# Patient Record
Sex: Female | Born: 1985 | Race: Black or African American | Hispanic: No | Marital: Single | Smoking: Never smoker
Health system: Southern US, Community
[De-identification: ages and names within clinical notes are randomized; demographics above are authoritative.]

## PROBLEM LIST (undated history)

## (undated) DIAGNOSIS — I10 Essential (primary) hypertension: Secondary | ICD-10-CM

## (undated) DIAGNOSIS — R42 Dizziness and giddiness: Secondary | ICD-10-CM

## (undated) DIAGNOSIS — F419 Anxiety disorder, unspecified: Secondary | ICD-10-CM

## (undated) DIAGNOSIS — F32A Depression, unspecified: Secondary | ICD-10-CM

## (undated) HISTORY — DX: Dizziness and giddiness: R42

## (undated) HISTORY — PX: NO PAST SURGERIES: SHX2092

## (undated) HISTORY — DX: Depression, unspecified: F32.A

## (undated) HISTORY — DX: Anxiety disorder, unspecified: F41.9

---
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
## 2004-03-18 ENCOUNTER — Emergency Department (HOSPITAL_COMMUNITY): Admission: EM | Admit: 2004-03-18 | Discharge: 2004-03-18 | Payer: Self-pay | Admitting: Emergency Medicine

## 2004-05-15 ENCOUNTER — Emergency Department (HOSPITAL_COMMUNITY): Admission: EM | Admit: 2004-05-15 | Discharge: 2004-05-16 | Payer: Self-pay | Admitting: Emergency Medicine

## 2005-03-23 ENCOUNTER — Emergency Department (HOSPITAL_COMMUNITY): Admission: EM | Admit: 2005-03-23 | Discharge: 2005-03-23 | Payer: Self-pay | Admitting: Family Medicine

## 2005-04-17 ENCOUNTER — Emergency Department (HOSPITAL_COMMUNITY): Admission: EM | Admit: 2005-04-17 | Discharge: 2005-04-17 | Payer: Self-pay | Admitting: Family Medicine

## 2005-05-09 ENCOUNTER — Emergency Department (HOSPITAL_COMMUNITY): Admission: EM | Admit: 2005-05-09 | Discharge: 2005-05-09 | Payer: Self-pay | Admitting: Family Medicine

## 2005-05-24 ENCOUNTER — Emergency Department (HOSPITAL_COMMUNITY): Admission: EM | Admit: 2005-05-24 | Discharge: 2005-05-24 | Payer: Self-pay | Admitting: Family Medicine

## 2006-02-16 ENCOUNTER — Emergency Department (HOSPITAL_COMMUNITY): Admission: EM | Admit: 2006-02-16 | Discharge: 2006-02-17 | Payer: Self-pay | Admitting: Emergency Medicine

## 2006-02-23 ENCOUNTER — Emergency Department (HOSPITAL_COMMUNITY): Admission: EM | Admit: 2006-02-23 | Discharge: 2006-02-24 | Payer: Self-pay | Admitting: Emergency Medicine

## 2006-02-28 ENCOUNTER — Emergency Department (HOSPITAL_COMMUNITY): Admission: EM | Admit: 2006-02-28 | Discharge: 2006-03-01 | Payer: Self-pay | Admitting: Emergency Medicine

## 2006-02-28 ENCOUNTER — Emergency Department (HOSPITAL_COMMUNITY): Admission: EM | Admit: 2006-02-28 | Discharge: 2006-02-28 | Payer: Self-pay | Admitting: Emergency Medicine

## 2006-04-07 ENCOUNTER — Ambulatory Visit: Payer: Self-pay | Admitting: Family Medicine

## 2006-10-22 ENCOUNTER — Emergency Department (HOSPITAL_COMMUNITY): Admission: EM | Admit: 2006-10-22 | Discharge: 2006-10-22 | Payer: Self-pay | Admitting: Family Medicine

## 2007-01-07 ENCOUNTER — Emergency Department (HOSPITAL_COMMUNITY): Admission: EM | Admit: 2007-01-07 | Discharge: 2007-01-07 | Payer: Self-pay | Admitting: Family Medicine

## 2007-02-16 ENCOUNTER — Emergency Department (HOSPITAL_COMMUNITY): Admission: EM | Admit: 2007-02-16 | Discharge: 2007-02-16 | Payer: Self-pay | Admitting: Emergency Medicine

## 2007-07-12 ENCOUNTER — Emergency Department (HOSPITAL_COMMUNITY): Admission: EM | Admit: 2007-07-12 | Discharge: 2007-07-12 | Payer: Self-pay | Admitting: Family Medicine

## 2007-09-20 ENCOUNTER — Emergency Department (HOSPITAL_COMMUNITY): Admission: EM | Admit: 2007-09-20 | Discharge: 2007-09-20 | Payer: Self-pay | Admitting: Family Medicine

## 2008-04-18 ENCOUNTER — Emergency Department (HOSPITAL_COMMUNITY): Admission: EM | Admit: 2008-04-18 | Discharge: 2008-04-18 | Payer: Self-pay | Admitting: Family Medicine

## 2008-07-28 ENCOUNTER — Emergency Department (HOSPITAL_COMMUNITY): Admission: EM | Admit: 2008-07-28 | Discharge: 2008-07-28 | Payer: Self-pay | Admitting: Family Medicine

## 2008-09-17 ENCOUNTER — Emergency Department (HOSPITAL_COMMUNITY): Admission: EM | Admit: 2008-09-17 | Discharge: 2008-09-18 | Payer: Self-pay | Admitting: Emergency Medicine

## 2009-09-03 ENCOUNTER — Emergency Department (HOSPITAL_COMMUNITY): Admission: EM | Admit: 2009-09-03 | Discharge: 2009-09-03 | Payer: Self-pay | Admitting: Emergency Medicine

## 2009-11-25 ENCOUNTER — Emergency Department (HOSPITAL_COMMUNITY): Admission: EM | Admit: 2009-11-25 | Discharge: 2009-11-25 | Payer: Self-pay | Admitting: Emergency Medicine

## 2010-02-01 ENCOUNTER — Emergency Department (HOSPITAL_COMMUNITY): Admission: EM | Admit: 2010-02-01 | Discharge: 2010-02-01 | Payer: Self-pay | Admitting: Emergency Medicine

## 2010-04-18 ENCOUNTER — Emergency Department (HOSPITAL_COMMUNITY): Admission: EM | Admit: 2010-04-18 | Discharge: 2010-04-18 | Payer: Self-pay | Admitting: Family Medicine

## 2010-04-20 ENCOUNTER — Emergency Department (HOSPITAL_COMMUNITY): Admission: EM | Admit: 2010-04-20 | Discharge: 2010-04-20 | Payer: Self-pay | Admitting: Emergency Medicine

## 2010-04-29 ENCOUNTER — Emergency Department (HOSPITAL_COMMUNITY): Admission: EM | Admit: 2010-04-29 | Discharge: 2010-04-29 | Payer: Self-pay | Admitting: Emergency Medicine

## 2010-07-03 ENCOUNTER — Emergency Department (HOSPITAL_COMMUNITY)
Admission: EM | Admit: 2010-07-03 | Discharge: 2010-07-04 | Payer: Self-pay | Source: Home / Self Care | Admitting: Emergency Medicine

## 2010-07-06 LAB — POCT I-STAT, CHEM 8
BUN: 11 mg/dL (ref 6–23)
Calcium, Ion: 1.16 mmol/L (ref 1.12–1.32)
Chloride: 110 mEq/L (ref 96–112)
Creatinine, Ser: 0.9 mg/dL (ref 0.4–1.2)
Glucose, Bld: 88 mg/dL (ref 70–99)
HCT: 42 % (ref 36.0–46.0)
Hemoglobin: 14.3 g/dL (ref 12.0–15.0)
Potassium: 3.7 mEq/L (ref 3.5–5.1)
Sodium: 143 mEq/L (ref 135–145)
TCO2: 23 mmol/L (ref 0–100)

## 2010-07-06 LAB — URINALYSIS, ROUTINE W REFLEX MICROSCOPIC
Bilirubin Urine: NEGATIVE
Ketones, ur: NEGATIVE mg/dL
Nitrite: NEGATIVE
Protein, ur: NEGATIVE mg/dL
Specific Gravity, Urine: 1.02 (ref 1.005–1.030)
Urine Glucose, Fasting: NEGATIVE mg/dL
Urobilinogen, UA: 0.2 mg/dL (ref 0.0–1.0)
pH: 7.5 (ref 5.0–8.0)

## 2010-07-06 LAB — URINE MICROSCOPIC-ADD ON

## 2010-07-06 LAB — URINE CULTURE
Colony Count: NO GROWTH
Culture  Setup Time: 201201140557
Culture: NO GROWTH

## 2010-07-06 LAB — POCT PREGNANCY, URINE: Preg Test, Ur: NEGATIVE

## 2010-07-24 ENCOUNTER — Inpatient Hospital Stay (INDEPENDENT_AMBULATORY_CARE_PROVIDER_SITE_OTHER)
Admission: RE | Admit: 2010-07-24 | Discharge: 2010-07-24 | Disposition: A | Discharge status: Home / Self Care | Payer: Self-pay | Source: Ambulatory Visit | Attending: Family Medicine | Admitting: Family Medicine

## 2010-07-24 ENCOUNTER — Emergency Department (HOSPITAL_COMMUNITY)
Admission: EM | Admit: 2010-07-24 | Discharge: 2010-07-24 | Disposition: A | Discharge status: Home / Self Care | Payer: Self-pay | Attending: Emergency Medicine | Admitting: Emergency Medicine

## 2010-07-24 ENCOUNTER — Emergency Department (HOSPITAL_COMMUNITY): Payer: Self-pay

## 2010-07-24 DIAGNOSIS — R109 Unspecified abdominal pain: Secondary | ICD-10-CM

## 2010-07-24 DIAGNOSIS — N390 Urinary tract infection, site not specified: Secondary | ICD-10-CM | POA: Insufficient documentation

## 2010-07-24 LAB — BASIC METABOLIC PANEL
BUN: 7 mg/dL (ref 6–23)
CO2: 22 mEq/L (ref 19–32)
Calcium: 9.3 mg/dL (ref 8.4–10.5)
Chloride: 108 mEq/L (ref 96–112)
Creatinine, Ser: 0.84 mg/dL (ref 0.4–1.2)
GFR calc Af Amer: >60 mL/min (ref >60)
GFR calc non Af Amer: >60 mL/min (ref >60)
Glucose, Bld: 87 mg/dL (ref 70–99)
Potassium: 3.8 mEq/L (ref 3.5–5.1)
Sodium: 140 mEq/L (ref 135–145)

## 2010-07-24 LAB — URINALYSIS, ROUTINE W REFLEX MICROSCOPIC
Bilirubin Urine: NEGATIVE
Hgb urine dipstick: NEGATIVE
Ketones, ur: NEGATIVE mg/dL
Nitrite: NEGATIVE
Protein, ur: NEGATIVE mg/dL
Specific Gravity, Urine: 1.02 (ref 1.005–1.030)
Urine Glucose, Fasting: NEGATIVE mg/dL
Urobilinogen, UA: 1 mg/dL (ref 0.0–1.0)
pH: 7 (ref 5.0–8.0)

## 2010-07-24 LAB — DIFFERENTIAL
Basophils Absolute: 0 10*3/uL (ref 0.0–0.1)
Basophils Relative: 0 % (ref 0–1)
Eosinophils Absolute: 0.1 10*3/uL (ref 0.0–0.7)
Eosinophils Relative: 1 % (ref 0–5)
Lymphocytes Relative: 50 % — ABNORMAL HIGH (ref 12–46)
Lymphs Abs: 2.4 10*3/uL (ref 0.7–4.0)
Monocytes Absolute: 0.3 10*3/uL (ref 0.1–1.0)
Monocytes Relative: 7 % (ref 3–12)
Neutro Abs: 1.9 10*3/uL (ref 1.7–7.7)
Neutrophils Relative %: 41 % — ABNORMAL LOW (ref 43–77)

## 2010-07-24 LAB — POCT PREGNANCY, URINE
Preg Test, Ur: NEGATIVE
Preg Test, Ur: NEGATIVE

## 2010-07-24 LAB — POCT URINALYSIS DIPSTICK
Bilirubin Urine: NEGATIVE
Hgb urine dipstick: NEGATIVE
Ketones, ur: NEGATIVE mg/dL
Nitrite: NEGATIVE
Protein, ur: NEGATIVE mg/dL
Specific Gravity, Urine: 1.02 (ref 1.005–1.030)
Urine Glucose, Fasting: NEGATIVE mg/dL
Urobilinogen, UA: 0.2 mg/dL (ref 0.0–1.0)
pH: 7 (ref 5.0–8.0)

## 2010-07-24 LAB — CBC
HCT: 41.8 % (ref 36.0–46.0)
Hemoglobin: 14.4 g/dL (ref 12.0–15.0)
MCH: 29 pg (ref 26.0–34.0)
MCHC: 34.4 g/dL (ref 30.0–36.0)
MCV: 84.1 fL (ref 78.0–100.0)
Platelets: 170 10*3/uL (ref 150–400)
RBC: 4.97 MIL/uL (ref 3.87–5.11)
RDW: 13 % (ref 11.5–15.5)
WBC: 4.7 10*3/uL (ref 4.0–10.5)

## 2010-07-24 LAB — URINE MICROSCOPIC-ADD ON

## 2010-07-26 LAB — URINE CULTURE
Colony Count: 30000
Culture  Setup Time: 201202031841

## 2010-09-01 LAB — BASIC METABOLIC PANEL
BUN: 10 mg/dL (ref 6–23)
CO2: 21 mEq/L (ref 19–32)
Calcium: 9.3 mg/dL (ref 8.4–10.5)
Chloride: 111 mEq/L (ref 96–112)
Creatinine, Ser: 0.88 mg/dL (ref 0.4–1.2)
GFR calc Af Amer: >60 mL/min (ref >60)
GFR calc non Af Amer: >60 mL/min (ref >60)
Glucose, Bld: 106 mg/dL — ABNORMAL HIGH (ref 70–99)
Potassium: 3.4 mEq/L — ABNORMAL LOW (ref 3.5–5.1)
Sodium: 139 mEq/L (ref 135–145)

## 2010-09-01 LAB — POCT CARDIAC MARKERS
CKMB, poc: <1 ng/mL — ABNORMAL LOW (ref 1.0–8.0)
Myoglobin, poc: 36.7 ng/mL (ref 12–200)
Myoglobin, poc: 47.2 ng/mL (ref 12–200)

## 2010-09-01 LAB — CBC
HCT: 40.6 % (ref 36.0–46.0)
Hemoglobin: 13.8 g/dL (ref 12.0–15.0)
MCH: 29.1 pg (ref 26.0–34.0)
MCHC: 34.1 g/dL (ref 30.0–36.0)
MCV: 85.2 fL (ref 78.0–100.0)
Platelets: 164 10*3/uL (ref 150–400)
RBC: 4.76 MIL/uL (ref 3.87–5.11)
RDW: 13.9 % (ref 11.5–15.5)
WBC: 5.8 10*3/uL (ref 4.0–10.5)

## 2010-09-01 LAB — DIFFERENTIAL
Basophils Absolute: 0.1 10*3/uL (ref 0.0–0.1)
Basophils Relative: 1 % (ref 0–1)
Eosinophils Absolute: 0.1 10*3/uL (ref 0.0–0.7)
Eosinophils Relative: 1 % (ref 0–5)
Lymphocytes Relative: 33 % (ref 12–46)
Lymphs Abs: 1.9 10*3/uL (ref 0.7–4.0)
Monocytes Absolute: 0.4 10*3/uL (ref 0.1–1.0)
Monocytes Relative: 7 % (ref 3–12)
Neutro Abs: 3.3 10*3/uL (ref 1.7–7.7)
Neutrophils Relative %: 58 % (ref 43–77)

## 2010-09-02 LAB — URINALYSIS, ROUTINE W REFLEX MICROSCOPIC
Bilirubin Urine: NEGATIVE
Glucose, UA: NEGATIVE mg/dL
Ketones, ur: 15 mg/dL — AB
Nitrite: NEGATIVE
Protein, ur: NEGATIVE mg/dL
Specific Gravity, Urine: 1.02 (ref 1.005–1.030)
Specific Gravity, Urine: 1.03 (ref 1.005–1.030)
Urobilinogen, UA: 1 mg/dL (ref 0.0–1.0)
pH: 6 (ref 5.0–8.0)

## 2010-09-02 LAB — URINE CULTURE
Colony Count: 15000
Culture  Setup Time: 201110292019

## 2010-09-02 LAB — URINE MICROSCOPIC-ADD ON

## 2010-09-02 LAB — POCT URINALYSIS DIPSTICK
Bilirubin Urine: NEGATIVE
Nitrite: NEGATIVE
Specific Gravity, Urine: 1.025 (ref 1.005–1.030)
pH: 6.5 (ref 5.0–8.0)

## 2010-09-07 LAB — DIFFERENTIAL
Basophils Absolute: 0 10*3/uL (ref 0.0–0.1)
Basophils Relative: 1 % (ref 0–1)
Eosinophils Absolute: 0 10*3/uL (ref 0.0–0.7)
Eosinophils Relative: 1 % (ref 0–5)
Lymphocytes Relative: 30 % (ref 12–46)
Lymphs Abs: 1.4 10*3/uL (ref 0.7–4.0)
Monocytes Absolute: 0.3 10*3/uL (ref 0.1–1.0)
Monocytes Relative: 7 % (ref 3–12)
Neutro Abs: 2.9 10*3/uL (ref 1.7–7.7)
Neutrophils Relative %: 63 % (ref 43–77)

## 2010-09-07 LAB — URINE MICROSCOPIC-ADD ON

## 2010-09-07 LAB — CBC
HCT: 41.9 % (ref 36.0–46.0)
Hemoglobin: 14.5 g/dL (ref 12.0–15.0)
MCHC: 34.6 g/dL (ref 30.0–36.0)
MCV: 86.4 fL (ref 78.0–100.0)
Platelets: 149 10*3/uL — ABNORMAL LOW (ref 150–400)
RBC: 4.85 MIL/uL (ref 3.87–5.11)
RDW: 13.4 % (ref 11.5–15.5)
WBC: 4.6 10*3/uL (ref 4.0–10.5)

## 2010-09-07 LAB — URINALYSIS, ROUTINE W REFLEX MICROSCOPIC
Bilirubin Urine: NEGATIVE
Glucose, UA: NEGATIVE mg/dL
Hgb urine dipstick: NEGATIVE
Ketones, ur: NEGATIVE mg/dL
Nitrite: NEGATIVE
Protein, ur: NEGATIVE mg/dL
Specific Gravity, Urine: 1.026 (ref 1.005–1.030)
Urobilinogen, UA: 1 mg/dL (ref 0.0–1.0)
pH: 6.5 (ref 5.0–8.0)

## 2010-09-07 LAB — BASIC METABOLIC PANEL
BUN: 8 mg/dL (ref 6–23)
CO2: 21 mEq/L (ref 19–32)
Calcium: 8.9 mg/dL (ref 8.4–10.5)
Chloride: 111 mEq/L (ref 96–112)
Creatinine, Ser: 0.83 mg/dL (ref 0.4–1.2)
GFR calc Af Amer: >60 mL/min (ref >60)
GFR calc non Af Amer: >60 mL/min (ref >60)
Glucose, Bld: 98 mg/dL (ref 70–99)
Potassium: 3.1 mEq/L — ABNORMAL LOW (ref 3.5–5.1)
Sodium: 139 mEq/L (ref 135–145)

## 2010-09-07 LAB — POCT PREGNANCY, URINE: Preg Test, Ur: NEGATIVE

## 2010-09-07 LAB — URINE CULTURE: Colony Count: 1000

## 2010-09-07 LAB — D-DIMER, QUANTITATIVE: D-Dimer, Quant: 0.44 ug/mL-FEU (ref 0.00–0.48)

## 2010-09-13 LAB — POCT I-STAT, CHEM 8
BUN: 13 mg/dL (ref 6–23)
Calcium, Ion: 1.16 mmol/L (ref 1.12–1.32)
HCT: 43 % (ref 36.0–46.0)
Hemoglobin: 14.6 g/dL (ref 12.0–15.0)
Sodium: 141 mEq/L (ref 135–145)
TCO2: 25 mmol/L (ref 0–100)

## 2010-09-13 LAB — D-DIMER, QUANTITATIVE: D-Dimer, Quant: 0.4 ug/mL-FEU (ref 0.00–0.48)

## 2010-10-01 LAB — URINE MICROSCOPIC-ADD ON

## 2010-10-01 LAB — URINALYSIS, ROUTINE W REFLEX MICROSCOPIC
Glucose, UA: NEGATIVE mg/dL
Protein, ur: 30 mg/dL — AB
Specific Gravity, Urine: 1.035 — ABNORMAL HIGH (ref 1.005–1.030)

## 2010-10-01 LAB — WET PREP, GENITAL: Yeast Wet Prep HPF POC: NONE SEEN

## 2010-10-01 LAB — GC/CHLAMYDIA PROBE AMP, GENITAL: GC Probe Amp, Genital: NEGATIVE

## 2010-11-06 NOTE — Discharge Summary (Signed)
[facility]  Patient:    Kiara Klein, Kiara Klein Visit Number: [id number] MRN: [id number]          Service Type: OBS Location: [id number] Attending Physician:  Tammi Sou Dictated by:   Marvis Moeller, C.N.M. Admit Date:  03/20/2000 Discharge Date: 03/24/2000                             Discharge Summary  ADMISSION DIAGNOSES:          1. Intrauterine pregnancy at 29-4/7 weeks.                               2. Threatened preterm labor.  DISCHARGE DIAGNOSES:          1. Intrauterine pregnancy at 25-5/7 weeks.                               2. Stable preterm cervical change.  HISTORY:                      This is a 25 year old primigravida who presented at 29-4/7 weeks dated by a 25+ week scan who came in reporting contractions for about a week intermittently and recurring on day of admission.  She also was reporting a change in the amount of her vaginal discharge.  At her routine prenatal visit at [facility], she had been given azithromycin for WBCs on wet prep. She denied urinary symptoms and no leaking or bleeding.  PAST OBSTETRICAL HISTORY:     Noncontributory.  PAST GYNECOLOGIC HISTORY:     No history of STDs.  MEDICATIONS:                  Prenatal vitamins.  ALLERGIES:                    None. ______ noncontributory.  PREGNANCY COURSE:             Her pregnancy course was essentially uncomplicated though she entered care in late second trimester.  Of note, initially her platelets were 206,000.  PHYSICAL EXAMINATION:  VITAL SIGNS:                  Blood pressure 108/65, pulse 94, fetal heart rate in 140s and reactive without decelerations.  ABDOMEN:                      Contractions were occurring every 4 to 10 minutes and palpated mild.  LUNGS:                        Clear.  HEART:                        Regular rate and rhythm.  EXTREMITIES:                  Without edema.  PELVIC:                        Cervical exam as 150, -2, cephalic.  LABORATORY DATA:              Cultures were done for gonorrhea, chlamydia, and group B strep, all of which were subsequently negative.  She  had an ultrasound prior to admission which revealed a cervical length shortened to 2.1 cm.  HOSPITAL COURSE:              In maternity admissions, she was given terbutaline initially to which she responded well.  She was admitted for IV Unasyn and IV fluids along with further monitoring.  The patient remained stable with minimal uterine activity on Unasyn and tocolytic.  She did receive betamethasone, and on day #2, she was started on Procardia 60 mg XL 1 a day after her ultrasound revealed a dynamic cervix, 1.2 to 2.5.  Her platelets were borderline low, so she had preeclampsia labs done, all of which were normal.  Her ultrasound did reveal good growth.  On day of discharge, the cervix was soft, closed, 2.5 cm, long, high, and without low uterine segment development.  She was, therefore, sent home on March 06, 2000, on bed rest.  She had seen a Child psychotherapist in hopes of getting a home bound Runner, broadcasting/film/video.  She is to follow up three days post discharge at the high-risk clinic and was continued on prenatal vitamin 1 a day and Procardia XL 60 mg 1 p.o. q.i.d.Dictated by:   Marvis Moeller, C.N.M. Attending Physician:  Tammi Sou DD:  04/12/00 TD:  04/12/00 Job: [id number]

## 2010-12-03 ENCOUNTER — Emergency Department (HOSPITAL_COMMUNITY)
Admission: EM | Admit: 2010-12-03 | Discharge: 2010-12-03 | Disposition: A | Discharge status: Home / Self Care | Payer: Medicaid Other | Attending: Emergency Medicine | Admitting: Emergency Medicine

## 2010-12-03 DIAGNOSIS — R42 Dizziness and giddiness: Secondary | ICD-10-CM | POA: Insufficient documentation

## 2010-12-03 DIAGNOSIS — R079 Chest pain, unspecified: Secondary | ICD-10-CM | POA: Insufficient documentation

## 2010-12-03 LAB — URINALYSIS, ROUTINE W REFLEX MICROSCOPIC
Bilirubin Urine: NEGATIVE
Nitrite: NEGATIVE
Specific Gravity, Urine: 1.017 (ref 1.005–1.030)
Urobilinogen, UA: 1 mg/dL (ref 0.0–1.0)

## 2010-12-03 LAB — CBC
HCT: 45.7 % (ref 36.0–46.0)
MCH: 28 pg (ref 26.0–34.0)
MCHC: 33.7 g/dL (ref 30.0–36.0)
MCV: 83.1 fL (ref 78.0–100.0)
RDW: 13.4 % (ref 11.5–15.5)

## 2010-12-03 LAB — DIFFERENTIAL
Basophils Absolute: 0 10*3/uL (ref 0.0–0.1)
Eosinophils Relative: 1 % (ref 0–5)
Lymphocytes Relative: 37 % (ref 12–46)
Monocytes Absolute: 0.4 10*3/uL (ref 0.1–1.0)

## 2010-12-03 LAB — POCT I-STAT, CHEM 8
Calcium, Ion: 1.12 mmol/L (ref 1.12–1.32)
Glucose, Bld: 79 mg/dL (ref 70–99)
HCT: 50 % — ABNORMAL HIGH (ref 36.0–46.0)
Hemoglobin: 17 g/dL — ABNORMAL HIGH (ref 12.0–15.0)
TCO2: 23 mmol/L (ref 0–100)

## 2010-12-03 LAB — POCT PREGNANCY, URINE: Preg Test, Ur: NEGATIVE

## 2010-12-03 LAB — URINE MICROSCOPIC-ADD ON

## 2011-01-16 ENCOUNTER — Emergency Department (HOSPITAL_COMMUNITY)
Admission: EM | Admit: 2011-01-16 | Discharge: 2011-01-16 | Disposition: A | Discharge status: Home / Self Care | Payer: Medicaid Other | Attending: Emergency Medicine | Admitting: Emergency Medicine

## 2011-01-16 DIAGNOSIS — R042 Hemoptysis: Secondary | ICD-10-CM | POA: Insufficient documentation

## 2011-01-16 DIAGNOSIS — R0989 Other specified symptoms and signs involving the circulatory and respiratory systems: Secondary | ICD-10-CM | POA: Insufficient documentation

## 2011-01-16 DIAGNOSIS — R110 Nausea: Secondary | ICD-10-CM | POA: Insufficient documentation

## 2011-01-16 DIAGNOSIS — R0609 Other forms of dyspnea: Secondary | ICD-10-CM | POA: Insufficient documentation

## 2011-01-16 DIAGNOSIS — M79609 Pain in unspecified limb: Secondary | ICD-10-CM | POA: Insufficient documentation

## 2011-01-16 DIAGNOSIS — R079 Chest pain, unspecified: Secondary | ICD-10-CM | POA: Insufficient documentation

## 2011-01-16 DIAGNOSIS — Z8614 Personal history of Methicillin resistant Staphylococcus aureus infection: Secondary | ICD-10-CM | POA: Insufficient documentation

## 2011-01-16 LAB — URINALYSIS, ROUTINE W REFLEX MICROSCOPIC
Ketones, ur: NEGATIVE mg/dL
Nitrite: NEGATIVE
Specific Gravity, Urine: 1.02 (ref 1.005–1.030)
Urobilinogen, UA: 0.2 mg/dL (ref 0.0–1.0)
pH: 7 (ref 5.0–8.0)

## 2011-01-16 LAB — URINE MICROSCOPIC-ADD ON

## 2011-01-16 LAB — D-DIMER, QUANTITATIVE: D-Dimer, Quant: 0.25 ug/mL-FEU (ref 0.00–0.48)

## 2011-01-18 LAB — POCT I-STAT, CHEM 8
BUN: 8 mg/dL (ref 6–23)
Creatinine, Ser: 1 mg/dL (ref 0.50–1.10)
Potassium: 3.5 mEq/L (ref 3.5–5.1)
Sodium: 141 mEq/L (ref 135–145)

## 2011-01-18 LAB — POCT PREGNANCY, URINE: Preg Test, Ur: NEGATIVE

## 2011-04-02 LAB — POCT URINALYSIS DIP (DEVICE)
Bilirubin Urine: NEGATIVE
Hgb urine dipstick: NEGATIVE
Nitrite: NEGATIVE
Specific Gravity, Urine: 1.02
pH: 6

## 2011-04-02 LAB — I-STAT 8, (EC8 V) (CONVERTED LAB)
Acid-base deficit: 3 — ABNORMAL HIGH
BUN: 7
Chloride: 107
HCT: 48 — ABNORMAL HIGH
Hemoglobin: 16.3 — ABNORMAL HIGH
Operator id: 247071
Potassium: 3.4 — ABNORMAL LOW
pCO2, Ven: 41.9 — ABNORMAL LOW

## 2011-04-02 LAB — TSH: TSH: 0.682

## 2011-04-02 LAB — CBC
Platelets: 180
RBC: 5.24 — ABNORMAL HIGH
WBC: 4.5

## 2011-04-05 LAB — HERPES SIMPLEX VIRUS CULTURE: Culture: NOT DETECTED

## 2011-04-05 LAB — GC/CHLAMYDIA PROBE AMP, GENITAL
Chlamydia, DNA Probe: NEGATIVE
GC Probe Amp, Genital: NEGATIVE

## 2011-04-05 LAB — POCT URINALYSIS DIP (DEVICE)
Glucose, UA: NEGATIVE
Nitrite: NEGATIVE
Operator id: 239701
Protein, ur: >=300 — AB
Urobilinogen, UA: 1

## 2011-04-05 LAB — POCT PREGNANCY, URINE
Operator id: 239701
Preg Test, Ur: NEGATIVE

## 2011-04-05 LAB — WET PREP, GENITAL: Clue Cells Wet Prep HPF POC: NONE SEEN

## 2012-02-11 IMAGING — CR DG ABDOMEN 1V
1 series · 1 of 1 positions shown · non-contrast
Comparison: None

CLINICAL DATA: Left flank pain.

ABDOMEN - 1 VIEW

[view not recorded]
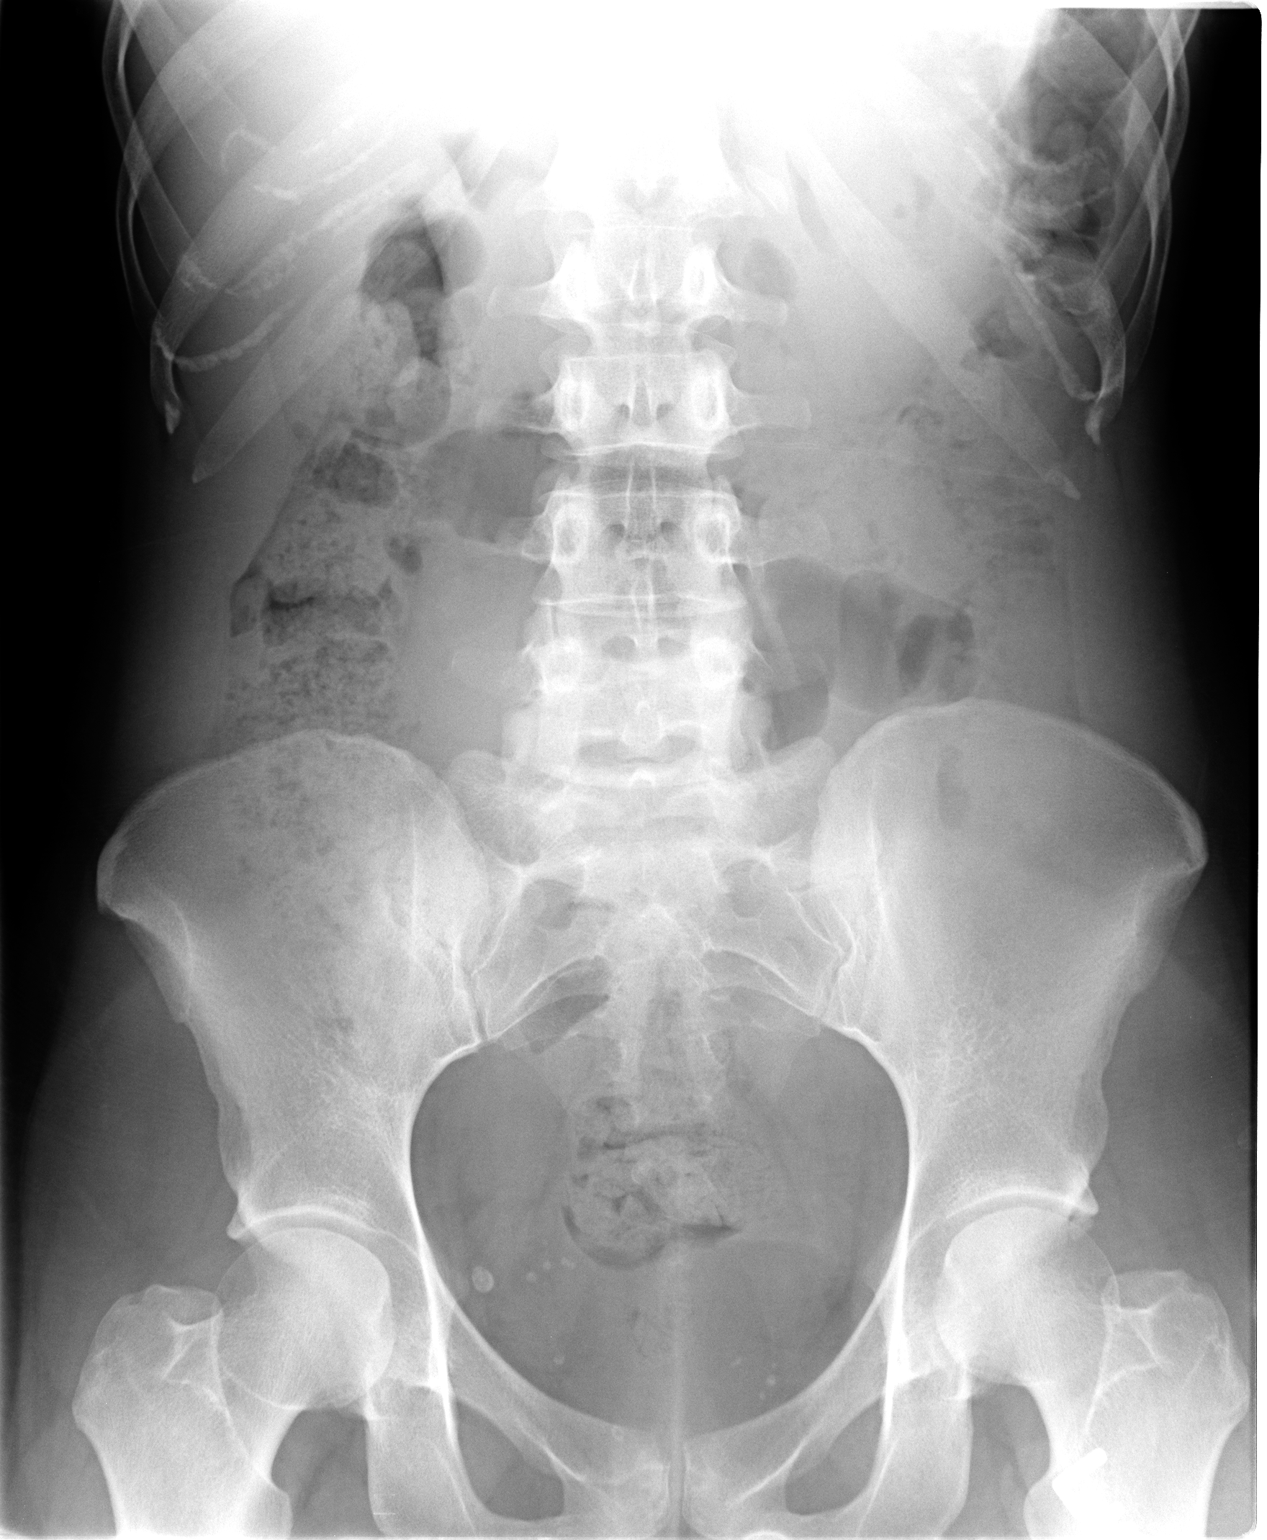

[1 of 1 positions shown; findings below may reference images not displayed]

FINDINGS: The bowel gas pattern is unremarkable.
There is no evidence of bowel obstruction.
No suspicious calcifications are noted.
The bony structures are unremarkable.
IMPRESSION: No acute abnormalities.

## 2012-03-21 ENCOUNTER — Encounter (HOSPITAL_COMMUNITY): Payer: Self-pay | Admitting: Emergency Medicine

## 2012-03-21 ENCOUNTER — Emergency Department (HOSPITAL_COMMUNITY)
Admission: EM | Admit: 2012-03-21 | Discharge: 2012-03-21 | Disposition: A | Discharge status: Home / Self Care | Payer: Self-pay | Attending: Emergency Medicine | Admitting: Emergency Medicine

## 2012-03-21 DIAGNOSIS — R109 Unspecified abdominal pain: Secondary | ICD-10-CM | POA: Insufficient documentation

## 2012-03-21 LAB — URINALYSIS, ROUTINE W REFLEX MICROSCOPIC
Bilirubin Urine: NEGATIVE
Ketones, ur: NEGATIVE mg/dL
Nitrite: NEGATIVE
Protein, ur: NEGATIVE mg/dL
Urobilinogen, UA: 0.2 mg/dL (ref 0.0–1.0)

## 2012-03-21 MED ORDER — POLYETHYLENE GLYCOL 3350 17 G PO PACK
17.0000 g | PACK | Freq: Every day | ORAL | Status: DC
Start: 2012-03-21 — End: 2013-08-28

## 2012-03-21 NOTE — ED Notes (Deleted)
Pt states that she has been having abdominal pain x 4 wks.  Pain has been staying the same.  Some days she has cramping and other days she has pain/discomfort.  Generalized abd pain.  Denies vomiting but has had nausea and one episode of diarrhea on Sunday.

## 2012-03-21 NOTE — ED Provider Notes (Signed)
History     CSN: [id number]  Arrival date & time 03/21/12  1447   First MD Initiated Contact with Patient 03/21/12 1657      Chief Complaint  Patient presents with  . Abdominal Pain  . Nausea    (Consider location/radiation/quality/duration/timing/severity/associated sxs/prior treatment) Patient is a 26 y.o. female presenting with abdominal pain. The history is provided by the patient.  Abdominal Pain The primary symptoms of the illness include abdominal pain. The primary symptoms of the illness do not include fever, nausea, vomiting, diarrhea, dysuria or vaginal discharge.  Additional symptoms associated with the illness include constipation. Associated symptoms comments: She describes 4 weeks of abdominal pain that is diffuse and associated with bloating. No dysuria, N, V or fever. The pain is no better or worse than when it began. She has chronic constipation that is unchanged. No blood in her bowel movements. She denies vaginal symptoms and reports regular female health exams..    No past medical history on file.  No past surgical history on file.  No family history on file.  History  Substance Use Topics  . Smoking status: Never Smoker   . Smokeless tobacco: Not on file  . Alcohol Use: Yes    OB History    Grav Para Term Preterm Abortions TAB SAB Ect Mult Living                  Review of Systems  Constitutional: Negative for fever.  Cardiovascular: Negative for chest pain.  Gastrointestinal: Positive for abdominal pain and constipation. Negative for nausea, vomiting, diarrhea and blood in stool.  Genitourinary: Negative for dysuria and vaginal discharge.    Allergies  Bactrim and Penicillins  Home Medications  No current outpatient prescriptions on file.  BP 122/81  Pulse 107  Temp 98.5 F (36.9 C) (Oral)  Resp 17  SpO2 100%  LMP 03/17/2012  Physical Exam  Constitutional: She appears well-developed and well-nourished.  HENT:  Head: Normocephalic.   Neck: Normal range of motion. Neck supple.  Cardiovascular: Normal rate and regular rhythm.   Pulmonary/Chest: Effort normal and breath sounds normal.  Abdominal: Soft. Bowel sounds are normal. There is no rebound and no guarding.       Abdomen is tender diffusely but only mildly and is soft. BS active. No mass.   Musculoskeletal: Normal range of motion.  Neurological: She is alert. No cranial nerve deficit.  Skin: Skin is warm and dry. No rash noted.  Psychiatric: She has a normal mood and affect.    ED Course  Procedures (including critical care time)  Labs Reviewed  URINALYSIS, ROUTINE W REFLEX MICROSCOPIC - Abnormal; Notable for the following:    APPearance CLOUDY (*)     Specific Gravity, Urine 1.034 (*)     All other components within normal limits  PREGNANCY, URINE   No results found. Results for orders placed during the hospital encounter of 03/21/12  URINALYSIS, ROUTINE W REFLEX MICROSCOPIC      Component Value Range   Color, Urine YELLOW  YELLOW   APPearance CLOUDY (*) CLEAR   Specific Gravity, Urine 1.034 (*) 1.005 - 1.030   pH 5.5  5.0 - 8.0   Glucose, UA NEGATIVE  NEGATIVE mg/dL   Hgb urine dipstick NEGATIVE  NEGATIVE   Bilirubin Urine NEGATIVE  NEGATIVE   Ketones, ur NEGATIVE  NEGATIVE mg/dL   Protein, ur NEGATIVE  NEGATIVE mg/dL   Urobilinogen, UA 0.2  0.0 - 1.0 mg/dL   Nitrite NEGATIVE  NEGATIVE   Leukocytes, UA NEGATIVE  NEGATIVE  PREGNANCY, URINE      Component Value Range   Preg Test, Ur NEGATIVE  NEGATIVE     No diagnosis found. 1. Abdominal pain   MDM  Patient has same abdominal pain that is unchanged after 4 weeks. No fever. No acute process suspected. Discussed follow up with GI and patient agrees.        Rodena Medin, PA-C 03/21/12 [phone]

## 2012-03-21 NOTE — ED Notes (Signed)
Pt has been having generalized abd pain x 4 wks that has not gotten better or worse.  States she has had nausea but no vomiting and 1 episode of diarrhea.  Takes depo shot and thinks it might be related to that.

## 2012-03-24 NOTE — ED Provider Notes (Signed)
Medical screening examination/treatment/procedure(s) were performed by non-physician practitioner and as supervising physician I was immediately available for consultation/collaboration.   Yeriel Mineo E Martavius Lusty, MD 03/24/12 1640 

## 2013-08-28 ENCOUNTER — Encounter (HOSPITAL_COMMUNITY): Payer: Self-pay | Admitting: Emergency Medicine

## 2013-08-28 ENCOUNTER — Emergency Department (HOSPITAL_COMMUNITY)
Admission: EM | Admit: 2013-08-28 | Discharge: 2013-08-28 | Disposition: A | Discharge status: Home / Self Care | Payer: 59 | Attending: Emergency Medicine | Admitting: Emergency Medicine

## 2013-08-28 DIAGNOSIS — R1013 Epigastric pain: Secondary | ICD-10-CM | POA: Insufficient documentation

## 2013-08-28 DIAGNOSIS — Z3202 Encounter for pregnancy test, result negative: Secondary | ICD-10-CM | POA: Insufficient documentation

## 2013-08-28 DIAGNOSIS — R112 Nausea with vomiting, unspecified: Secondary | ICD-10-CM | POA: Insufficient documentation

## 2013-08-28 DIAGNOSIS — Z880 Allergy status to penicillin: Secondary | ICD-10-CM | POA: Insufficient documentation

## 2013-08-28 LAB — COMPREHENSIVE METABOLIC PANEL
ALK PHOS: 80 U/L (ref 39–117)
ALT: 15 U/L (ref 0–35)
AST: 23 U/L (ref 0–37)
Albumin: 3.9 g/dL (ref 3.5–5.2)
BUN: 12 mg/dL (ref 6–23)
CALCIUM: 9 mg/dL (ref 8.4–10.5)
CO2: 22 mEq/L (ref 19–32)
Chloride: 103 mEq/L (ref 96–112)
Creatinine, Ser: 0.82 mg/dL (ref 0.50–1.10)
GFR calc non Af Amer: >90 mL/min (ref >90)
GLUCOSE: 98 mg/dL (ref 70–99)
POTASSIUM: 3.7 meq/L (ref 3.7–5.3)
SODIUM: 140 meq/L (ref 137–147)
TOTAL PROTEIN: 7.4 g/dL (ref 6.0–8.3)
Total Bilirubin: 1.1 mg/dL (ref 0.3–1.2)

## 2013-08-28 LAB — URINE MICROSCOPIC-ADD ON

## 2013-08-28 LAB — CBC WITH DIFFERENTIAL/PLATELET
BASOS ABS: 0 10*3/uL (ref 0.0–0.1)
Basophils Relative: 0 % (ref 0–1)
EOS ABS: 0 10*3/uL (ref 0.0–0.7)
EOS PCT: 0 % (ref 0–5)
HCT: 43.3 % (ref 36.0–46.0)
Hemoglobin: 15 g/dL (ref 12.0–15.0)
LYMPHS ABS: 0.8 10*3/uL (ref 0.7–4.0)
LYMPHS PCT: 10 % — AB (ref 12–46)
MCH: 29.1 pg (ref 26.0–34.0)
MCHC: 34.6 g/dL (ref 30.0–36.0)
MCV: 84.1 fL (ref 78.0–100.0)
Monocytes Absolute: 0.4 10*3/uL (ref 0.1–1.0)
Monocytes Relative: 5 % (ref 3–12)
NEUTROS PCT: 85 % — AB (ref 43–77)
Neutro Abs: 6.7 10*3/uL (ref 1.7–7.7)
PLATELETS: 165 10*3/uL (ref 150–400)
RBC: 5.15 MIL/uL — AB (ref 3.87–5.11)
RDW: 13.3 % (ref 11.5–15.5)
WBC: 7.9 10*3/uL (ref 4.0–10.5)

## 2013-08-28 LAB — PREGNANCY, URINE: Preg Test, Ur: NEGATIVE

## 2013-08-28 LAB — URINALYSIS, ROUTINE W REFLEX MICROSCOPIC
Bilirubin Urine: NEGATIVE
Glucose, UA: NEGATIVE mg/dL
Ketones, ur: 15 mg/dL — AB
NITRITE: NEGATIVE
PH: 7 (ref 5.0–8.0)
Protein, ur: NEGATIVE mg/dL
SPECIFIC GRAVITY, URINE: 1.029 (ref 1.005–1.030)
UROBILINOGEN UA: 1 mg/dL (ref 0.0–1.0)

## 2013-08-28 LAB — LIPASE, BLOOD: Lipase: 38 U/L (ref 11–59)

## 2013-08-28 MED ORDER — ONDANSETRON HCL 4 MG/2ML IJ SOLN
4.0000 mg | Freq: Once | INTRAMUSCULAR | Status: AC
  Administered 2013-08-28: 4 mg via INTRAVENOUS
  Filled 2013-08-28: qty 2

## 2013-08-28 MED ORDER — ONDANSETRON 8 MG PO TBDP: 8.0000 mg | ORAL_TABLET | Freq: Three times a day (TID) | ORAL | Status: DC | PRN

## 2013-08-28 MED ORDER — ONDANSETRON 8 MG PO TBDP: 8.0000 mg | ORAL_TABLET | Freq: Once | ORAL | Status: DC

## 2013-08-28 MED ORDER — MORPHINE SULFATE 4 MG/ML IJ SOLN
4.0000 mg | Freq: Once | INTRAMUSCULAR | Status: AC
  Administered 2013-08-28: 4 mg via INTRAVENOUS
  Filled 2013-08-28: qty 1

## 2013-08-28 MED ORDER — SODIUM CHLORIDE 0.9 % IV SOLN
1000.0000 mL | Freq: Once | INTRAVENOUS | Status: AC
Start: 2013-08-28 — End: 2013-08-28
  Administered 2013-08-28: 1000 mL via INTRAVENOUS

## 2013-08-28 MED ORDER — SODIUM CHLORIDE 0.9 % IV SOLN
1000.0000 mL | INTRAVENOUS | Status: DC
  Administered 2013-08-28: 1000 mL via INTRAVENOUS

## 2013-08-28 NOTE — ED Notes (Signed)
Pt complains of vomiting since 6pm tonight, she ate some [employer] cookies and a fish sandwich from [employer]

## 2013-08-28 NOTE — ED Provider Notes (Signed)
CSN: [id number]     Arrival date & time 08/28/13  0103 History   First MD Initiated Contact with Patient 08/28/13 0112     Chief Complaint  Patient presents with  . Emesis     HPI Patient reports developing nausea and vomiting tonight.  She thinks this may be related to food.  She denies fevers or chills.  No diarrhea.  No recent sick contacts.  Crampy upper abdominal discomfort is described.  No other complaints.  No chest pain shortness breath.  No back pain.  No urinary complaints.  No abnormal menstrual cycles.   History reviewed. No pertinent past medical history. History reviewed. No pertinent past surgical history. History reviewed. No pertinent family history. History  Substance Use Topics  . Smoking status: Never Smoker   . Smokeless tobacco: Not on file  . Alcohol Use: Yes   OB History   Grav Para Term Preterm Abortions TAB SAB Ect Mult Living                 Review of Systems  All other systems reviewed and are negative.      Allergies  Bactrim and Penicillins  Home Medications   Current Outpatient Rx  Name  Route  Sig  Dispense  Refill  . medroxyPROGESTERone (DEPO-PROVERA) 150 MG/ML injection   Intramuscular   Inject 150 mg into the muscle every 3 (three) months.         . ondansetron (ZOFRAN ODT) 8 MG disintegrating tablet   Oral   Take 1 tablet (8 mg total) by mouth every 8 (eight) hours as needed for nausea or vomiting.   10 tablet   0    BP 137/85  Pulse 110  Temp(Src) 98.5 F (36.9 C) (Oral)  Resp 18  Ht 5\' 5"  (1.651 m)  Wt 164 lb (74.39 kg)  BMI 27.29 kg/m2  SpO2 96%  LMP 06/30/2013 Physical Exam  Nursing note and vitals reviewed. Constitutional: She is oriented to person, place, and time. She appears well-developed and well-nourished. No distress.  HENT:  Head: Normocephalic and atraumatic.  Eyes: EOM are normal.  Neck: Normal range of motion.  Cardiovascular: Normal rate, regular rhythm and normal heart sounds.    Pulmonary/Chest: Effort normal and breath sounds normal.  Abdominal: Soft. She exhibits no distension.  Mild epigastric tenderness.  No right upper quadrant tenderness.  No guarding or rebound.  Musculoskeletal: Normal range of motion.  Neurological: She is alert and oriented to person, place, and time.  Skin: Skin is warm and dry.  Psychiatric: She has a normal mood and affect. Judgment normal.    ED Course  Procedures (including critical care time) Labs Review Labs Reviewed  URINALYSIS, ROUTINE W REFLEX MICROSCOPIC - Abnormal; Notable for the following:    Hgb urine dipstick SMALL (*)    Ketones, ur 15 (*)    Leukocytes, UA TRACE (*)    All other components within normal limits  CBC WITH DIFFERENTIAL - Abnormal; Notable for the following:    RBC 5.15 (*)    Neutrophils Relative % 85 (*)    Lymphocytes Relative 10 (*)    All other components within normal limits  URINE MICROSCOPIC-ADD ON - Abnormal; Notable for the following:    Squamous Epithelial / LPF FEW (*)    All other components within normal limits  PREGNANCY, URINE  COMPREHENSIVE METABOLIC PANEL  LIPASE, BLOOD   Imaging Review No results found.   EKG Interpretation None  MDM   Final diagnoses:  Nausea & vomiting    4:02 AM Patient feels much better at this time.  Tolerating oral fluids.  Discharge hematocrit condition.  Repeat abdominal exam is benign.  Patient understands to return to the ER for new or worsening symptoms.    Lyanne CoKevin M Nyna Chilton, MD 08/28/13 [phone]

## 2013-08-28 NOTE — ED Notes (Signed)
Pt states having abdominal pain w/ nausea and vomiting, denies diarrhea.

## 2013-08-28 NOTE — Discharge Instructions (Signed)

## 2013-11-09 ENCOUNTER — Encounter (HOSPITAL_COMMUNITY): Payer: Self-pay | Admitting: Emergency Medicine

## 2013-11-09 ENCOUNTER — Emergency Department (INDEPENDENT_AMBULATORY_CARE_PROVIDER_SITE_OTHER)
Admission: EM | Admit: 2013-11-09 | Discharge: 2013-11-09 | Disposition: A | Discharge status: Home / Self Care | Payer: 59 | Source: Home / Self Care | Attending: Family Medicine | Admitting: Family Medicine

## 2013-11-09 DIAGNOSIS — W268XXA Contact with other sharp object(s), not elsewhere classified, initial encounter: Secondary | ICD-10-CM

## 2013-11-09 DIAGNOSIS — M79606 Pain in leg, unspecified: Secondary | ICD-10-CM

## 2013-11-09 DIAGNOSIS — M79609 Pain in unspecified limb: Secondary | ICD-10-CM

## 2013-11-09 DIAGNOSIS — S91339A Puncture wound without foreign body, unspecified foot, initial encounter: Secondary | ICD-10-CM

## 2013-11-09 DIAGNOSIS — Z23 Encounter for immunization: Secondary | ICD-10-CM

## 2013-11-09 DIAGNOSIS — S91309A Unspecified open wound, unspecified foot, initial encounter: Secondary | ICD-10-CM

## 2013-11-09 MED ORDER — IBUPROFEN 800 MG PO TABS: 800.0000 mg | ORAL_TABLET | Freq: Three times a day (TID) | ORAL | Status: DC | PRN

## 2013-11-09 MED ORDER — TETANUS-DIPHTH-ACELL PERTUSSIS 5-2.5-18.5 LF-MCG/0.5 IM SUSP
INTRAMUSCULAR | Status: AC
  Filled 2013-11-09: qty 0.5

## 2013-11-09 MED ORDER — CEFUROXIME AXETIL 250 MG PO TABS: 250.0000 mg | ORAL_TABLET | Freq: Two times a day (BID) | ORAL | Status: DC

## 2013-11-09 MED ORDER — TETANUS-DIPHTH-ACELL PERTUSSIS 5-2.5-18.5 LF-MCG/0.5 IM SUSP
0.5000 mL | Freq: Once | INTRAMUSCULAR | Status: AC
  Administered 2013-11-09: 0.5 mL via INTRAMUSCULAR

## 2013-11-09 NOTE — ED Notes (Addendum)
Right foot pain after stepping on an earring last night.  Reports post of earring went completely into foot.  Earring was removed.  Visible hole on sole of foot.  Today says foot is sore and lower leg is sore.  Wiggles toes, radial pulses 2 plus

## 2013-11-09 NOTE — Discharge Instructions (Signed)
Puncture Wound °A puncture wound is an injury that extends through all layers of the skin and into the tissue beneath the skin (subcutaneous tissue). Puncture wounds become infected easily because germs often enter the body and go beneath the skin during the injury. Having a deep wound with a small entrance point makes it difficult for your caregiver to adequately clean the wound. This is especially true if you have stepped on a nail and it has passed through a dirty shoe or other situations where the wound is obviously contaminated. °CAUSES  °Many puncture wounds involve glass, nails, splinters, fish hooks, or other objects that enter the skin (foreign bodies). A puncture wound may also be caused by a human bite or animal bite. °DIAGNOSIS  °A puncture wound is usually diagnosed by your history and a physical exam. You may need to have an X-ray or an ultrasound to check for any foreign bodies still in the wound. °TREATMENT  °· Your caregiver will clean the wound as thoroughly as possible. Depending on the location of the wound, a bandage (dressing) may be applied. °· Your caregiver might prescribe antibiotic medicines. °· You may need a follow-up visit to check on your wound. Follow all instructions as directed by your caregiver. °HOME CARE INSTRUCTIONS  °· Change your dressing once per day, or as directed by your caregiver. If the dressing sticks, it may be removed by soaking the area in water. °· If your caregiver has given you follow-up instructions, it is very important that you return for a follow-up appointment. Not following up as directed could result in a chronic or permanent injury, pain, and disability. °· Only take over-the-counter or prescription medicines for pain, discomfort, or fever as directed by your caregiver. °· If you are given antibiotics, take them as directed. Finish them even if you start to feel better. °You may need a tetanus shot if: °· You cannot remember when you had your last tetanus  shot. °· You have never had a tetanus shot. °If you got a tetanus shot, your arm may swell, get red, and feel warm to the touch. This is common and not a problem. If you need a tetanus shot and you choose not to have one, there is a rare chance of getting tetanus. Sickness from tetanus can be serious. °You may need a rabies shot if an animal bite caused your puncture wound. °SEEK MEDICAL CARE IF:  °· You have redness, swelling, or increasing pain in the wound. °· You have red streaks going away from the wound. °· You notice a bad smell coming from the wound or dressing. °· You have yellowish-white fluid (pus) coming from the wound. °· You are treated with an antibiotic for infection, but the infection is not getting better. °· You notice something in the wound, such as rubber from your shoe, cloth, or another object. °· You have a fever. °· You have severe pain. °· You have difficulty breathing. °· You feel dizzy or faint. °· You cannot stop vomiting. °· You lose feeling, develop numbness, or cannot move a limb below the wound. °· Your symptoms worsen. °MAKE SURE YOU: °· Understand these instructions. °· Will watch your condition. °· Will get help right away if you are not doing well or get worse. °Document Released: 03/17/2005 Document Revised: 08/30/2011 Document Reviewed: 11/24/2010 °ExitCare® Patient Information ©2014 [employer]. ° °

## 2013-11-09 NOTE — ED Provider Notes (Signed)
CSN: [id number]     Arrival date & time 11/09/13  1051 History   First MD Initiated Contact with Patient 11/09/13 1134     Chief Complaint  Patient presents with  . Foot Pain   (Consider location/radiation/quality/duration/timing/severity/associated sxs/prior Treatment) HPI Comments: 28 year old female presents complaining of right leg pain after stepping on an earring last night. She was walking and stepped on a study ear ring which stabbed completely up into the bottom of her foot. She removed the earring and she does not think there could be any piece of it stuck in her foot. This has caused pain in the foot and she has been limping since it happened. When she woke up this morning, she has pain in her right calf as well. She denies any increasing pain in the area of the puncture. No leg swelling or numbness.  Patient is a 28 y.o. female presenting with lower extremity pain.  Foot Pain    History reviewed. No pertinent past medical history. History reviewed. No pertinent past surgical history. No family history on file. History  Substance Use Topics  . Smoking status: Never Smoker   . Smokeless tobacco: Not on file  . Alcohol Use: Yes   OB History   Grav Para Term Preterm Abortions TAB SAB Ect Mult Living                 Review of Systems  Musculoskeletal: Positive for myalgias (right calf pain).  Skin: Positive for wound.  All other systems reviewed and are negative.   Allergies  Bactrim and Penicillins  Home Medications   Prior to Admission medications   Medication Sig Start Date End Date Taking? Authorizing Provider  cefUROXime (CEFTIN) 250 MG tablet Take 1 tablet (250 mg total) by mouth 2 (two) times daily with a meal. 11/09/13   Graylon GoodZachary H Keegan Bensch, PA-C  ibuprofen (ADVIL,MOTRIN) 800 MG tablet Take 1 tablet (800 mg total) by mouth every 8 (eight) hours as needed. 11/09/13   Graylon GoodZachary H Floretta Petro, PA-C  medroxyPROGESTERone (DEPO-PROVERA) 150 MG/ML injection Inject 150 mg into  the muscle every 3 (three) months.    Historical Provider, MD  ondansetron (ZOFRAN ODT) 8 MG disintegrating tablet Take 1 tablet (8 mg total) by mouth every 8 (eight) hours as needed for nausea or vomiting. 08/28/13   Lyanne CoKevin M Campos, MD   BP 129/82  Pulse 88  Temp(Src) 99.1 F (37.3 C) (Oral)  Resp 14  SpO2 99%  LMP 10/10/2013 Physical Exam  Nursing note and vitals reviewed. Constitutional: She is oriented to person, place, and time. Vital signs are normal. She appears well-developed and well-nourished. No distress.  HENT:  Head: Normocephalic and atraumatic.  Cardiovascular:  Pulses:      Dorsalis pedis pulses are 2+ on the right side.  Pulmonary/Chest: Effort normal. No respiratory distress.  Musculoskeletal:       Right lower leg: She exhibits tenderness (Minimal tenderness in the calf). She exhibits no swelling and no edema.       Right foot: She exhibits tenderness (Small puncture wound in the bottom of the right foot without any surrounding erythema, induration, or fluctuance, with no discharge, minimally tender). She exhibits normal range of motion and normal capillary refill.  Neurological: She is alert and oriented to person, place, and time. She has normal strength. Coordination normal.  Skin: Skin is warm and dry. No rash noted. She is not diaphoretic.  Psychiatric: She has a normal mood and affect. Judgment normal.  ED Course  Procedures (including critical care time) Labs Review Labs Reviewed - No data to display  Imaging Review No results found.   MDM   1. Puncture wound of foot   2. Leg pain    She is not up-to-date on tetanus so giving T. dap, will give Ceftin for infection prophylaxis after a puncture wound, and ibuprofen for pain. I think she probably just has some soreness in the leg partly because she was walking abnormally because of the wound. She will followup again if this continues to worsen.   Meds ordered this encounter  Medications  . Tdap  (BOOSTRIX) injection 0.5 mL    Sig:   . ibuprofen (ADVIL,MOTRIN) 800 MG tablet    Sig: Take 1 tablet (800 mg total) by mouth every 8 (eight) hours as needed.    Dispense:  30 tablet    Refill:  0    Order Specific Question:  Supervising Provider    Answer:  Linna Hoff [phone]  . cefUROXime (CEFTIN) 250 MG tablet    Sig: Take 1 tablet (250 mg total) by mouth 2 (two) times daily with a meal.    Dispense:  10 tablet    Refill:  0    Order Specific Question:  Supervising Provider    Answer:  Bradd Canary D [[id number]]       Graylon Good, PA-C 11/09/13 [phone]

## 2013-11-10 NOTE — ED Provider Notes (Signed)
Medical screening examination/treatment/procedure(s) were performed by resident physician or non-physician practitioner and as supervising physician I was immediately available for consultation/collaboration.   Iyauna Sing DOUGLAS MD.   Dylin Ihnen D Hollyann Pablo, MD 11/10/13 1118 

## 2013-12-13 ENCOUNTER — Encounter (HOSPITAL_COMMUNITY): Payer: Self-pay | Admitting: Emergency Medicine

## 2013-12-13 ENCOUNTER — Emergency Department (HOSPITAL_COMMUNITY)
Admission: EM | Admit: 2013-12-13 | Discharge: 2013-12-13 | Disposition: A | Discharge status: Home / Self Care | Payer: 59 | Attending: Emergency Medicine | Admitting: Emergency Medicine

## 2013-12-13 DIAGNOSIS — Z79899 Other long term (current) drug therapy: Secondary | ICD-10-CM | POA: Insufficient documentation

## 2013-12-13 DIAGNOSIS — Z880 Allergy status to penicillin: Secondary | ICD-10-CM | POA: Insufficient documentation

## 2013-12-13 DIAGNOSIS — G44209 Tension-type headache, unspecified, not intractable: Secondary | ICD-10-CM | POA: Insufficient documentation

## 2013-12-13 DIAGNOSIS — G44219 Episodic tension-type headache, not intractable: Secondary | ICD-10-CM

## 2013-12-13 NOTE — ED Notes (Addendum)
Pt reports that a pressure/tightness has been in the posterior area of her head, she states it is "just uncomfortable." Pt also states this is accompanied by dizziness and headaches. Pt denies history of migraines, however reports headaches 1-2 per month. PT denies light/sound/smell sensitively. Pt is A/O x4, in NAD, and vitals are WDL.

## 2013-12-13 NOTE — Discharge Instructions (Signed)
At this time your providers do not feel your symptoms or exam are concerning for any emergent condition. It is recommended that you eat a healthy well-balanced diet and drink plenty of fluids. Get 7-8 hours of sleep every night. Take ibuprofen or Tylenol for headache. Followup with a primary care provider for continued evaluation treatment. Return to the emergency department at any time for changing or worsening symptoms.    Tension Headache A tension headache is pain, pressure, or aching felt over the front and sides of the head. Tension headaches often come after stress, feeling worried (anxiety), or feeling sad or down for a while (depressed). HOME CARE  Only take medicine as told by your doctor.  Lie down in a dark, quiet room when you have a headache.  Keep a journal to find out if certain things bring on headaches. For example, write down:  What you eat and drink.  How much sleep you get.  Any change to your diet or medicines.  Relax by getting a massage or doing other relaxing activities.  Put ice or heat packs on the head and neck area as told by your doctor.  Lessen stress.  Sit up straight. Do not tighten (tense) your muscles.  Quit smoking if you smoke.  Lessen how much alcohol you drink.  Lessen how much caffeine you drink, or stop drinking caffeine.  Eat and exercise regularly.  Get enough sleep.  Avoid using too much pain medicine. GET HELP RIGHT AWAY IF:   Your headache becomes really bad.  You have a fever.  You have a stiff neck.  You have trouble seeing.  Your muscles are weak, or you lose muscle control.  You lose your balance or have trouble walking.  You feel like you will pass out (faint), or you pass out.  You have really bad symptoms that are different than your first symptoms.  You have problems with the medicines given to you by your doctor.  Your medicines do not work.  Your headache feels different than the other  headaches.  You feel sick to your stomach (nauseous) or throw up (vomit). MAKE SURE YOU:   Understand these instructions.  Will watch your condition.  Will get help right away if you are not doing well or get worse. Document Released: 09/01/2009 Document Revised: 08/30/2011 Document Reviewed: 05/28/2011 [facility] Patient Information 2015 [employer], [place]. This information is not intended to replace advice given to you by your health care provider. Make sure you discuss any questions you have with your health care provider.

## 2013-12-13 NOTE — ED Provider Notes (Signed)
CSN: [id number]     Arrival date & time 12/13/13  1932 History   None    This chart was scribed for non-physician practitioner, Ivonne AndrewPeter Merek Niu PA-C, working with Hurman HornJohn M Bednar, MD by Arlan OrganAshley Leger, ED Scribe. This patient was seen in room WTR8/WTR8 and the patient's care was started at 8:05 PM.   Chief Complaint  Patient presents with  . Headache   The history is provided by the patient. No language interpreter was used.    HPI Comments: Kiara Klein is a 28 y.o. female who presents to the Emergency Department complaining of an intermittent, moderate, non-radiating HA x 4 days that has recently gradually worsened in the last 24 hours. Pt describes this HA as a tightness and pressure around her head. She also admits to lightheadedness described as feeling weak. She mentions frequent straining with bowel movements which she is concerned may be attributed to her HAs. No history of Migraines. However, pt reports 1-2 HAs a month. She has not been medicated in the past for previous HAs. She has tried OTC Tylenol without any improvement for current symptoms.  She denies any photophobia or auditory sensitivity. No recent trauma or injury. She denies any neck pain or myalgias. No fever, chills, diaphoresis, dysuria, SOB, cough, nausea, vomiting, diarrhea, visual disturbances, sinus pressure, congestion, numbness, or paresthesia. 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 In addition, pt states she works 3 jobs and is in school. Admits to an unbalanced diet secondary to her schedule.  LNMP 09/2012- She is currently on Depo with irregular menstrual cycle.  She is otherwise healthy without any medical problems. No other concerns this visit.  History reviewed. No pertinent past medical history. History reviewed. No pertinent past surgical history. No family history on file. History  Substance Use Topics  . Smoking status: Never Smoker   . Smokeless tobacco: Not on file  . Alcohol Use: Yes    OB History   Grav Para Term Preterm Abortions TAB SAB Ect Mult Living                 Review of Systems  Constitutional: Negative for fever, chills and activity change.  HENT: Negative for congestion, ear pain, sinus pressure and sore throat.   Eyes: Negative for redness.  Respiratory: Negative for cough and shortness of breath.   Gastrointestinal: Negative for nausea, vomiting, abdominal pain and diarrhea.  Skin: Negative for rash.  Neurological: Positive for dizziness and headaches.  Psychiatric/Behavioral: Negative for confusion.      Allergies  Bactrim and Penicillins  Home Medications   Prior to Admission medications   Medication Sig Start Date End Date Taking? Authorizing Provider  cefUROXime (CEFTIN) 250 MG tablet Take 1 tablet (250 mg total) by mouth 2 (two) times daily with a meal. 11/09/13   Graylon GoodZachary H Baker, PA-C  ibuprofen (ADVIL,MOTRIN) 800 MG tablet Take 1 tablet (800 mg total) by mouth every 8 (eight) hours as needed. 11/09/13   Graylon GoodZachary H Baker, PA-C  medroxyPROGESTERone (DEPO-PROVERA) 150 MG/ML injection Inject 150 mg into the muscle every 3 (three) months.    Historical Provider, MD  ondansetron (ZOFRAN ODT) 8 MG disintegrating tablet Take 1 tablet (8 mg total) by mouth every 8 (eight) hours as needed for nausea or vomiting. 08/28/13   Lyanne CoKevin M Campos, MD   Triage Vitals: BP 132/95  Pulse 82  Temp(Src) 99.7 F (37.6 C) (Oral)  Resp 18  SpO2 99%  LMP 10/04/2013   Physical  Exam  Nursing note and vitals reviewed. Constitutional: She is oriented to person, place, and time. She appears well-developed and well-nourished. No distress.  HENT:  Head: Normocephalic and atraumatic.  Right Ear: Tympanic membrane normal. No mastoid tenderness.  Left Ear: Tympanic membrane and external ear normal. No mastoid tenderness.  Nose: Nose normal.  Mouth/Throat: Oropharynx is clear and moist.  Eyes: Conjunctivae and EOM are normal. Pupils are equal, round, and reactive to  light.  Neck: Normal range of motion. Neck supple. No tracheal deviation present. No thyromegaly present.  No meningeal signs  Cardiovascular: Normal rate and regular rhythm.   No murmur heard. Pulmonary/Chest: Effort normal and breath sounds normal. No respiratory distress. She has no wheezes. She has no rales.  Abdominal: Soft. She exhibits no distension. There is no tenderness. There is no rigidity, no CVA tenderness and no tenderness at McBurney's point.  Musculoskeletal: Normal range of motion. She exhibits no edema and no tenderness.  Lymphadenopathy:    She has no cervical adenopathy.  Neurological: She is alert and oriented to person, place, and time. She has normal strength. No cranial nerve deficit or sensory deficit. She exhibits normal muscle tone. Coordination and gait normal.  Strength equal in all extremities  Skin: Skin is warm and dry. No rash noted.  Psychiatric: She has a normal mood and affect. Her behavior is normal.   ORTHOSTATIC VITALS: Supine: BP-116/91 Pulse-78 Sitting: BP-128/94 Pulse-78 Standing: BP-136/108 Pulse-82  ED Course  Procedures   DIAGNOSTIC STUDIES: Oxygen Saturation is 99% on RA, Normal by my interpretation.    COORDINATION OF CARE: 8:30 PM- Pt seen and evaluated.  Pt well appearing. She is afebrile.  Does not appear severly ill or toxic.  She has vague symptoms of an intermittent pressure to sides and back of head.  No mastoid tenderness.  Normal ears.  No muscular tenderness. Normal non-focal neuro exam. 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 She admits to poor diet, fluid intake and sleep.  At this time I have encouraged pt to try a healthier and more balanced diet. Have better rest and sleep.  Advised her to come back or see her PCP if symptoms worsen or if she notes any new symptoms. Discussed treatment plan with pt at bedside and pt agreed to plan.          MDM   Final diagnoses:  Episodic tension-type headache, not intractable       I personally performed the services described in this documentation, which was scribed in my presence. The recorded information has been reviewed and is accurate.    Angus Sellereter S Desare Duddy, PA-C 12/13/13 2043

## 2013-12-16 NOTE — ED Provider Notes (Signed)
Medical screening examination/treatment/procedure(s) were performed by non-physician practitioner and as supervising physician I was immediately available for consultation/collaboration.   EKG Interpretation None       Kiara HornJohn M Bednar, MD 12/16/13 2108

## 2015-08-30 ENCOUNTER — Ambulatory Visit (INDEPENDENT_AMBULATORY_CARE_PROVIDER_SITE_OTHER): Payer: PRIVATE HEALTH INSURANCE | Admitting: Family Medicine

## 2015-08-30 VITALS — BP 132/100 | HR 102 | Temp 99.3°F | Resp 16 | Ht 68.5 in | Wt 197.0 lb

## 2015-08-30 DIAGNOSIS — J0100 Acute maxillary sinusitis, unspecified: Secondary | ICD-10-CM

## 2015-08-30 MED ORDER — LEVOFLOXACIN 500 MG PO TABS: 500.0000 mg | ORAL_TABLET | Freq: Every day | ORAL | Status: DC

## 2015-08-30 NOTE — Patient Instructions (Addendum)
IF you received an x-ray today, you will receive an invoice from [facility]. Please contact [facility] at [phone] with questions or concerns regarding your invoice.   IF you received labwork today, you will receive an invoice from [employer]. Please contact [facility] at [phone] with questions or concerns regarding your invoice.   Our billing staff will not be able to assist you with questions regarding bills from these companies.  You will be contacted with the lab results as soon as they are available. The fastest way to get your results is to activate your My Chart account. Instructions are located on the last page of this paperwork. If you have not heard from [place] regarding the results in 2 weeks, please contact this office.  Wear a mask at work   Sinusitis, Adult Sinusitis is redness, soreness, and inflammation of the paranasal sinuses. Paranasal sinuses are air pockets within the bones of your face. They are located beneath your eyes, in the middle of your forehead, and above your eyes. In healthy paranasal sinuses, mucus is able to drain out, and air is able to circulate through them by way of your nose. However, when your paranasal sinuses are inflamed, mucus and air can become trapped. This can allow bacteria and other germs to grow and cause infection. Sinusitis can develop quickly and last only a short time (acute) or continue over a long period (chronic). Sinusitis that lasts for more than 12 weeks is considered chronic. CAUSES Causes of sinusitis include:  Allergies.  Structural abnormalities, such as displacement of the cartilage that separates your nostrils (deviated septum), which can decrease the air flow through your nose and sinuses and affect sinus drainage.  Functional abnormalities, such as when the small hairs (cilia) that line your sinuses and help remove mucus do not work properly or are not present. SIGNS AND  SYMPTOMS Symptoms of acute and chronic sinusitis are the same. The primary symptoms are pain and pressure around the affected sinuses. Other symptoms include:  Upper toothache.  Earache.  Headache.  Bad breath.  Decreased sense of smell and taste.  A cough, which worsens when you are lying flat.  Fatigue.  Fever.  Thick drainage from your nose, which often is green and may contain pus (purulent).  Swelling and warmth over the affected sinuses. DIAGNOSIS Your health care provider will perform a physical exam. During your exam, your health care provider may perform any of the following to help determine if you have acute sinusitis or chronic sinusitis:  Look in your nose for signs of abnormal growths in your nostrils (nasal polyps).  Tap over the affected sinus to check for signs of infection.  View the inside of your sinuses using an imaging device that has a light attached (endoscope). If your health care provider suspects that you have chronic sinusitis, one or more of the following tests may be recommended:  Allergy tests.  Nasal culture. A sample of mucus is taken from your nose, sent to a lab, and screened for bacteria.  Nasal cytology. A sample of mucus is taken from your nose and examined by your health care provider to determine if your sinusitis is related to an allergy. TREATMENT Most cases of acute sinusitis are related to a viral infection and will resolve on their own within 10 days. Sometimes, medicines are prescribed to help relieve symptoms of both acute and chronic sinusitis. These may include pain medicines, decongestants, nasal steroid sprays, or saline sprays. However, for sinusitis  related to a bacterial infection, your health care provider will prescribe antibiotic medicines. These are medicines that will help kill the bacteria causing the infection. Rarely, sinusitis is caused by a fungal infection. In these cases, your health care provider will prescribe  antifungal medicine. For some cases of chronic sinusitis, surgery is needed. Generally, these are cases in which sinusitis recurs more than 3 times per year, despite other treatments. HOME CARE INSTRUCTIONS  Drink plenty of water. Water helps thin the mucus so your sinuses can drain more easily.  Use a humidifier.  Inhale steam 3-4 times a day (for example, sit in the bathroom with the shower running).  Apply a warm, moist washcloth to your face 3-4 times a day, or as directed by your health care provider.  Use saline nasal sprays to help moisten and clean your sinuses.  Take medicines only as directed by your health care provider.  If you were prescribed either an antibiotic or antifungal medicine, finish it all even if you start to feel better. SEEK IMMEDIATE MEDICAL CARE IF:  You have increasing pain or severe headaches.  You have nausea, vomiting, or drowsiness.  You have swelling around your face.  You have vision problems.  You have a stiff neck.  You have difficulty breathing.   This information is not intended to replace advice given to you by your health care provider. Make sure you discuss any questions you have with your health care provider.   Document Released: 06/07/2005 Document Revised: 06/28/2014 Document Reviewed: 06/22/2011 Elsevier Interactive Patient Education [employer]2016 Elsevier Inc.

## 2015-08-30 NOTE — Progress Notes (Signed)
Patient ID: Kiara MillardKrystal D Lamica MRN: [id number], DOB: 06/30/1985, 30 y.o. Date of Encounter: 08/30/2015, 4:17 PM  Primary Physician: No PCP Per Patient  Chief Complaint:  Chief Complaint  Patient presents with  . Sore Throat  . Generalized Body Aches    HPI: 30 y.o. year old female presents with 4 day history of nasal congestion, post nasal drip, sore throat, sinus pressure, and cough. Afebrile. No chills. Nasal congestion thick and green/yellow. Sinus pressure is the worst symptom. Cough is productive secondary to post nasal drip and not associated with time of day. Ears feel full, leading to sensation of muffled hearing. Has tried OTC cold preps without success. No GI complaints.  She was on vacation last week and had a molar pulled on Tuesday  No recent antibiotics, recent travels, vomiting, or sick contacts   Works at [employer]  No leg trauma, sedentary periods, h/o cancer, or tobacco use.  History reviewed. No pertinent past medical history.   Home Meds: Prior to Admission medications   Medication Sig Start Date End Date Taking? Authorizing Provider  ibuprofen (ADVIL,MOTRIN) 800 MG tablet Take 1 tablet (800 mg total) by mouth every 8 (eight) hours as needed. 11/09/13   Graylon GoodZachary H Baker, PA-C  levofloxacin (LEVAQUIN) 500 MG tablet Take 1 tablet (500 mg total) by mouth daily. 08/30/15   Elvina SidleKurt Legacy Carrender, MD  medroxyPROGESTERone (DEPO-PROVERA) 150 MG/ML injection Inject 150 mg into the muscle every 3 (three) months.    Historical Provider, MD    Allergies:  Allergies  Allergen Reactions  . Bactrim [Sulfamethoxazole-Trimethoprim] Hives  . Penicillins Hives    Social History   Social History  . Marital Status: Single    Spouse Name: N/A  . Number of Children: N/A  . Years of Education: N/A   Occupational History  . Not on file.   Social History Main Topics  . Smoking status: Never Smoker   . Smokeless tobacco: Never Used  . Alcohol Use: 0.0 oz/week    0  Standard drinks or equivalent per week  . Drug Use: No  . Sexual Activity: Yes    Birth Control/ Protection: Injection   Other Topics Concern  . Not on file   Social History Narrative     Review of Systems: Constitutional: negative for chills, fever, night sweats or weight changes Cardiovascular: negative for chest pain or palpitations Respiratory: negative for hemoptysis, wheezing, or shortness of breath Abdominal: negative for abdominal pain, nausea, vomiting or diarrhea Dermatological: negative for rash Neurologic: negative for headache   Physical Exam: Blood pressure 132/100, pulse 102, temperature 99.3 F (37.4 C), temperature source Oral, resp. rate 16, height 5' 8.5" (1.74 m), weight 197 lb (89.359 kg), SpO2 98 %., Body mass index is 29.51 kg/(m^2). General: Well developed, well nourished, in no acute distress. Head: Normocephalic, atraumatic, eyes without discharge, sclera non-icteric, nares are congested. Bilateral auditory canals clear, TM's are without perforation, pearly grey with reflective cone of light bilaterally. Serous effusion bilaterally behind TM's. Maxillary sinus TTP. Oral cavity moist, dentition normal. Posterior pharynx with post nasal drip and mild erythema. No peritonsillar abscess or tonsillar exudate. Neck: Supple. No thyromegaly. Full ROM. No lymphadenopathy. Lungs: Clear bilaterally to auscultation without wheezes, rales, or rhonchi. Breathing is unlabored.  Heart: RRR with S1 S2. No murmurs, rubs, or gallops appreciated. Msk:  Strength and tone normal for age. Extremities: No clubbing or cyanosis. No edema. Neuro: Alert and oriented X 3. Moves all extremities spontaneously. CNII-XII grossly in tact. Psych:  Responds to questions appropriately with a normal affect.     ASSESSMENT AND PLAN:  30 y.o. year old female with sinusitis -   ICD-9-CM ICD-10-CM   1. Acute maxillary sinusitis, recurrence not specified 461.0 J01.00 levofloxacin (LEVAQUIN) 500  MG tablet    -Tylenol/Motrin prn -Rest/fluids -RTC precautions -RTC 3-5 days if no improvement  Signed, Elvina Sidle, MD 08/30/2015 4:17 PM

## 2016-02-10 ENCOUNTER — Ambulatory Visit (HOSPITAL_COMMUNITY)
Admission: EM | Admit: 2016-02-10 | Discharge: 2016-02-10 | Disposition: A | Discharge status: Home / Self Care | Payer: PRIVATE HEALTH INSURANCE

## 2016-02-10 ENCOUNTER — Encounter (HOSPITAL_COMMUNITY): Payer: Self-pay | Admitting: *Deleted

## 2016-02-10 DIAGNOSIS — J069 Acute upper respiratory infection, unspecified: Secondary | ICD-10-CM | POA: Diagnosis not present

## 2016-02-10 NOTE — ED Provider Notes (Signed)
[facility]    CSN: [id number] Arrival date & time: 8/22/[id number]  1844  First Provider Contact:  First MD Initiated Contact with Patient 02/10/16 1922        History   Chief Complaint Chief Complaint  Patient presents with  . URI    HPI Kiara Klein is a 30 y.o. female.   The history is provided by the patient.  URI  Presenting symptoms: congestion, cough, fever, rhinorrhea and sore throat   Severity:  Mild Onset quality:  Gradual Duration:  1 day Progression:  Worsening Chronicity:  New Relieved by:  None tried Worsened by:  Nothing Ineffective treatments:  None tried Associated symptoms: myalgias   Associated symptoms: no wheezing     History reviewed. No pertinent past medical history.  There are no active problems to display for this patient.   History reviewed. No pertinent surgical history.  OB History    No data available       Home Medications    Prior to Admission medications   Medication Sig Start Date End Date Taking? Authorizing Provider  ibuprofen (ADVIL,MOTRIN) 800 MG tablet Take 1 tablet (800 mg total) by mouth every 8 (eight) hours as needed. 11/09/13   Graylon GoodZachary H Baker, PA-C  levofloxacin (LEVAQUIN) 500 MG tablet Take 1 tablet (500 mg total) by mouth daily. 08/30/15   Elvina SidleKurt Lauenstein, MD  medroxyPROGESTERone (DEPO-PROVERA) 150 MG/ML injection Inject 150 mg into the muscle every 3 (three) months.    Historical Provider, MD    Family History History reviewed. No pertinent family history.  Social History Social History  Substance Use Topics  . Smoking status: Never Smoker  . Smokeless tobacco: Never Used  . Alcohol use 0.0 oz/week     Allergies   Bactrim [sulfamethoxazole-trimethoprim] and Penicillins   Review of Systems Review of Systems  Constitutional: Positive for fever.  HENT: Positive for congestion, postnasal drip, rhinorrhea and sore throat.   Respiratory: Positive for cough. Negative for shortness of  breath and wheezing.   Cardiovascular: Negative.   Gastrointestinal: Negative.   Musculoskeletal: Positive for myalgias.  Skin: Negative.   All other systems reviewed and are negative.    Physical Exam Triage Vital Signs ED Triage Vitals  Enc Vitals Group     BP 02/10/16 1904 132/100     Pulse Rate 02/10/16 1904 100     Resp 02/10/16 1904 16     Temp 02/10/16 1904 98.9 F (37.2 C)     Temp Source 02/10/16 1904 Oral     SpO2 02/10/16 1904 100 %     Weight --      Height --      Head Circumference --      Peak Flow --      Pain Score 02/10/16 1914 3     Pain Loc --      Pain Edu? --      Excl. in GC? --    No data found.   Updated Vital Signs BP 132/100 (BP Location: Right Arm)   Pulse 100   Temp 98.9 F (37.2 C) (Oral)   Resp 16   SpO2 100%   Visual Acuity Right Eye Distance:   Left Eye Distance:   Bilateral Distance:    Right Eye Near:   Left Eye Near:    Bilateral Near:     Physical Exam  Constitutional: She is oriented to person, place, and time. She appears well-developed and well-nourished. No distress.  HENT:  Right  Ear: External ear normal.  Left Ear: External ear normal.  Mouth/Throat: Mucous membranes are normal. Posterior oropharyngeal edema and posterior oropharyngeal erythema present. No tonsillar abscesses. Tonsils are 1+ on the right. Tonsils are 1+ on the left. No tonsillar exudate.  Cardiovascular: Normal rate, regular rhythm, normal heart sounds and intact distal pulses.   Pulmonary/Chest: Effort normal and breath sounds normal.  Abdominal: Soft. Bowel sounds are normal.  Neurological: She is alert and oriented to person, place, and time.  Skin: Skin is warm and dry. No rash noted.  Nursing note and vitals reviewed.    UC Treatments / Results  Labs (all labs ordered are listed, but only abnormal results are displayed) Labs Reviewed - No data to display  EKG  EKG Interpretation None       Radiology No results  found.  Procedures Procedures (including critical care time)  Medications Ordered in UC Medications - No data to display   Initial Impression / Assessment and Plan / UC Course  I have reviewed the triage vital signs and the nursing notes.  Pertinent labs & imaging results that were available during my care of the patient were reviewed by me and considered in my medical decision making (see chart for details).  Clinical Course      Final Clinical Impressions(s) / UC Diagnoses   Final diagnoses:  URI (upper respiratory infection)    New Prescriptions Discharge Medication List as of 02/10/2016  7:56 PM       Linna HoffJames D Massiah Longanecker, MD 02/13/16 2000

## 2016-02-10 NOTE — ED Triage Notes (Signed)
Pt  Reports   Symptoms    Of  Body  Aches       Headaches      Since  Last  Pm     Pt  Reports  Symptoms  Not  releived  By  otc   meds      daughtrer  Has    Similar  Symptoms  As  Well    Pt  Ambulated  To  Room  With a  Steady  Fluid gait

## 2016-04-10 ENCOUNTER — Encounter (HOSPITAL_COMMUNITY): Payer: Self-pay | Admitting: *Deleted

## 2016-04-10 ENCOUNTER — Emergency Department (HOSPITAL_COMMUNITY)
Admission: EM | Admit: 2016-04-10 | Discharge: 2016-04-10 | Disposition: A | Discharge status: Home / Self Care | Payer: BLUE CROSS/BLUE SHIELD | Attending: Emergency Medicine | Admitting: Emergency Medicine

## 2016-04-10 DIAGNOSIS — D751 Secondary polycythemia: Secondary | ICD-10-CM

## 2016-04-10 DIAGNOSIS — R42 Dizziness and giddiness: Secondary | ICD-10-CM | POA: Diagnosis present

## 2016-04-10 LAB — CBC WITH DIFFERENTIAL/PLATELET
BASOS PCT: 0 %
Basophils Absolute: 0 10*3/uL (ref 0.0–0.1)
EOS ABS: 0 10*3/uL (ref 0.0–0.7)
Eosinophils Relative: 0 %
HCT: 47.7 % — ABNORMAL HIGH (ref 36.0–46.0)
Hemoglobin: 16.1 g/dL — ABNORMAL HIGH (ref 12.0–15.0)
Lymphocytes Relative: 15 %
Lymphs Abs: 1.1 10*3/uL (ref 0.7–4.0)
MCH: 28.2 pg (ref 26.0–34.0)
MCHC: 33.8 g/dL (ref 30.0–36.0)
MCV: 83.7 fL (ref 78.0–100.0)
MONO ABS: 0.3 10*3/uL (ref 0.1–1.0)
MONOS PCT: 4 %
NEUTROS PCT: 81 %
Neutro Abs: 5.6 10*3/uL (ref 1.7–7.7)
Platelets: 227 10*3/uL (ref 150–400)
RBC: 5.7 MIL/uL — ABNORMAL HIGH (ref 3.87–5.11)
RDW: 13.6 % (ref 11.5–15.5)
WBC: 7 10*3/uL (ref 4.0–10.5)

## 2016-04-10 LAB — URINALYSIS, ROUTINE W REFLEX MICROSCOPIC
BILIRUBIN URINE: NEGATIVE
Glucose, UA: NEGATIVE mg/dL
Hgb urine dipstick: NEGATIVE
Ketones, ur: NEGATIVE mg/dL
Leukocytes, UA: NEGATIVE
NITRITE: NEGATIVE
Protein, ur: NEGATIVE mg/dL
SPECIFIC GRAVITY, URINE: 1.023 (ref 1.005–1.030)
pH: 8 (ref 5.0–8.0)

## 2016-04-10 LAB — COMPREHENSIVE METABOLIC PANEL
ALBUMIN: 4.5 g/dL (ref 3.5–5.0)
ALT: 21 U/L (ref 14–54)
ANION GAP: 8 (ref 5–15)
AST: 26 U/L (ref 15–41)
Alkaline Phosphatase: 82 U/L (ref 38–126)
BUN: 12 mg/dL (ref 6–20)
CHLORIDE: 109 mmol/L (ref 101–111)
CO2: 23 mmol/L (ref 22–32)
Calcium: 9.6 mg/dL (ref 8.9–10.3)
Creatinine, Ser: 0.89 mg/dL (ref 0.44–1.00)
GFR calc Af Amer: >60 mL/min (ref >60)
GFR calc non Af Amer: >60 mL/min (ref >60)
GLUCOSE: 99 mg/dL (ref 65–99)
POTASSIUM: 3.8 mmol/L (ref 3.5–5.1)
SODIUM: 140 mmol/L (ref 135–145)
Total Bilirubin: 0.7 mg/dL (ref 0.3–1.2)
Total Protein: 8.7 g/dL — ABNORMAL HIGH (ref 6.5–8.1)

## 2016-04-10 LAB — I-STAT BETA HCG BLOOD, ED (MC, WL, AP ONLY): I-stat hCG, quantitative: <5 m[IU]/mL (ref <5)

## 2016-04-10 MED ORDER — SODIUM CHLORIDE 0.9 % IV BOLUS (SEPSIS)
1000.0000 mL | Freq: Once | INTRAVENOUS | Status: AC
  Administered 2016-04-10: 1000 mL via INTRAVENOUS

## 2016-04-10 MED ORDER — ONDANSETRON 4 MG PO TBDP
4.0000 mg | ORAL_TABLET | Freq: Once | ORAL | Status: AC | PRN
  Administered 2016-04-10: 4 mg via ORAL
  Filled 2016-04-10: qty 1

## 2016-04-10 MED ORDER — ONDANSETRON HCL 4 MG/2ML IJ SOLN
4.0000 mg | Freq: Once | INTRAMUSCULAR | Status: AC
  Administered 2016-04-10: 4 mg via INTRAVENOUS
  Filled 2016-04-10: qty 2

## 2016-04-10 NOTE — Discharge Instructions (Signed)
Your blood work is normal today except for elevated hemoglobin. Try to get some rest, drink plenty of fluids, avoid stressors. Follow up with primary care doctor for further evaluation. Return if worsening.

## 2016-04-10 NOTE — ED Triage Notes (Signed)
Pt complains of dizziness, lightheadedness for the past 4 days. Pt states she had nausea and vomited once today. Pt denies abdominal pain, urinary symptoms.

## 2016-04-10 NOTE — ED Provider Notes (Signed)
[facility]-EMERGENCY DEPT Provider Note   CSN: [id number] Arrival date & time: 04/10/16  1613     History   Chief Complaint Chief Complaint  Patient presents with  . Dizziness    HPI Kiara Klein is a 30 y.o. female.  HPI Kiara Klein is a 30 y.o. female presents emergency department complaining of dizziness, nausea, vomiting.  Patient states her dizziness started 4 days ago.  She states dizziness comes and goes, worse when she stands up or if she is walking.  She reports several near-syncopal episodes, but denies any loss of consciousness.  Denies sensation of room spinning. She reports associated nausea.  She reports the vomiting started today.  Unable to keep anything down.  Denies any abdominal pain.  No back pain.  No blood in her emesis.  No change in her bowels. No URI symptoms. No any other complaints.   History reviewed. No pertinent past medical history.  There are no active problems to display for this patient.   History reviewed. No pertinent surgical history.  OB History    No data available       Home Medications    Prior to Admission medications   Medication Sig Start Date End Date Taking? Authorizing Provider  medroxyPROGESTERone (DEPO-PROVERA) 150 MG/ML injection Inject 150 mg into the muscle every 3 (three) months.   Yes Historical Provider, MD    Family History No family history on file.  Social History Social History  Substance Use Topics  . Smoking status: Never Smoker  . Smokeless tobacco: Never Used  . Alcohol use 0.0 oz/week     Allergies   Bactrim [sulfamethoxazole-trimethoprim] and Penicillins   Review of Systems Review of Systems  Constitutional: Negative for chills and fever.  Respiratory: Negative for cough, chest tightness and shortness of breath.   Cardiovascular: Negative for chest pain, palpitations and leg swelling.  Gastrointestinal: Positive for nausea and vomiting. Negative for abdominal pain and diarrhea.   Genitourinary: Negative for dysuria, flank pain, pelvic pain, vaginal bleeding, vaginal discharge and vaginal pain.  Musculoskeletal: Negative for arthralgias, myalgias, neck pain and neck stiffness.  Skin: Negative for rash.  Neurological: Positive for dizziness and light-headedness. Negative for weakness and headaches.  All other systems reviewed and are negative.    Physical Exam Updated Vital Signs BP 134/98 (BP Location: Right Arm)   Pulse 82   Temp 98.2 F (36.8 C) (Oral)   Resp 18   Ht 5\' 6"  (1.676 m)   Wt 90.7 kg   SpO2 99%   BMI 32.28 kg/m   Physical Exam  Constitutional: She appears well-developed and well-nourished. No distress.  HENT:  Head: Normocephalic.  Eyes: Conjunctivae and EOM are normal. Pupils are equal, round, and reactive to light.  Neck: Neck supple.  Cardiovascular: Normal rate, regular rhythm and normal heart sounds.   Pulmonary/Chest: Effort normal and breath sounds normal. No respiratory distress. She has no wheezes. She has no rales.  Abdominal: Soft. Bowel sounds are normal. She exhibits no distension. There is no tenderness. There is no rebound.  Musculoskeletal: She exhibits no edema.  Neurological: She is alert.  Skin: Skin is warm and dry.  Psychiatric: She has a normal mood and affect. Her behavior is normal.  Nursing note and vitals reviewed.    ED Treatments / Results  Labs (all labs ordered are listed, but only abnormal results are displayed) Labs Reviewed  CBC WITH DIFFERENTIAL/PLATELET - Abnormal; Notable for the following:  Result Value   RBC 5.70 (*)    Hemoglobin 16.1 (*)    HCT 47.7 (*)    All other components within normal limits  COMPREHENSIVE METABOLIC PANEL - Abnormal; Notable for the following:    Total Protein 8.7 (*)    All other components within normal limits  URINALYSIS, ROUTINE W REFLEX MICROSCOPIC (NOT AT [facility]) - Abnormal; Notable for the following:    APPearance HAZY (*)    All other components within  normal limits  I-STAT BETA HCG BLOOD, ED ([facility], [facility], [facility] ONLY)    EKG  EKG Interpretation None       Radiology No results found.  Procedures Procedures (including critical care time)  Medications Ordered in ED Medications  sodium chloride 0.9 % bolus 1,000 mL (not administered)  ondansetron (ZOFRAN) injection 4 mg (not administered)  ondansetron (ZOFRAN-ODT) disintegrating tablet 4 mg (4 mg Oral Given 04/10/16 1647)     Initial Impression / Assessment and Plan / ED Course  I have reviewed the triage vital signs and the nursing notes.  Pertinent labs & imaging results that were available during my care of the patient were reviewed by me and considered in my medical decision making (see chart for details).  Clinical Course    Patient seen and examined.  Patient went dizziness, weakness, nausea, vomiting.  She denied to distress.  Abdomen is nontender.  Vital signs are normal.  Will check orthostatics, will check CBC, CMP, pregnancy test, urinalysis.  Will give a liter of fluids and Zofran.  Patient received ODT Zofran in the waiting room, states she threw it up.  Patient's labs are normal other than elevated hemoglobin of 16.1.  Advised to follow-up for further studies.  Reviewed patient's records, history of elevated hemoglobin in the past.  Patient is not throwing up.  Tolerating by mouth fluids.  She is ambulatory in the hallway with no problems.  No abdominal pain.  Patient is not pregnant. Will dc home. Follow up as needed. Return precautions discussed.   Vitals:   04/10/16 1629 04/10/16 1723 04/10/16 1820 04/10/16 1935  BP: 134/92  134/98 124/89  Pulse: 87  82 84  Resp: 18  18 16   Temp: 98.2 F (36.8 C)     TempSrc: Oral     SpO2: 100% 100% 99% 100%  Weight: 90.7 kg     Height: 5\' 6"  (1.676 m)        Final Clinical Impressions(s) / ED Diagnoses   Final diagnoses:  Dizziness  Polycythemia    New Prescriptions Discharge Medication List as of 04/10/2016  8:43  PM       Jaynie Crumbleatyana Camyla Camposano, PA-C 04/10/16 2310    Mancel BaleElliott Wentz, MD 04/11/16 [phone]

## 2016-09-09 ENCOUNTER — Ambulatory Visit (INDEPENDENT_AMBULATORY_CARE_PROVIDER_SITE_OTHER): Payer: BLUE CROSS/BLUE SHIELD | Admitting: Sports Medicine

## 2016-09-09 ENCOUNTER — Encounter (INDEPENDENT_AMBULATORY_CARE_PROVIDER_SITE_OTHER): Payer: Self-pay

## 2016-09-09 ENCOUNTER — Encounter: Payer: Self-pay | Admitting: Sports Medicine

## 2016-09-09 VITALS — BP 120/94 | HR 103 | Ht 67.0 in | Wt 202.0 lb

## 2016-09-09 DIAGNOSIS — M76821 Posterior tibial tendinitis, right leg: Secondary | ICD-10-CM | POA: Diagnosis not present

## 2016-09-09 DIAGNOSIS — M25579 Pain in unspecified ankle and joints of unspecified foot: Secondary | ICD-10-CM | POA: Diagnosis not present

## 2016-09-10 ENCOUNTER — Encounter: Payer: Self-pay | Admitting: Sports Medicine

## 2016-09-10 NOTE — Progress Notes (Signed)
   Subjective:    Patient ID: Kiara MillardKrystal D Herford, female    DOB: 12/18/1985, 31 y.o.   MRN: [id number]  HPI chief complaint: Right foot pain  Very pleasant 31 year old female comes in today complaining of right foot pain. Pain has been present now for a couple of years. She denies any injury but rather describes an insidious onset of pain that has gotten worse over the past several weeks. She recently saw Dr. Madelon Lipsaffrey and had x-rays as well as an MRI of her right foot done. That MRI is available for my review. She was diagnosed with insertional peroneal tendinopathy and placed into a Cam Walker for several weeks. She was then referred to our office for possible orthotics. She localizes her pain to the medial aspect of her right foot. She has not noticed any swelling. She denies numbness or tingling. No prior surgeries on this foot. Pain is worse with standing and walking. Improves at rest. She has had some physical therapy including some iontophoresis but it is expensive. She has also had some limited success with Duexis. She has aslo been icing intermittently.  Past medical history reviewed Medical history reviewed Allergies reviewed Patient works as an Set designerMRI tech for [employer]    Review of Systems    as above Objective:   Physical Exam  Well-developed, well-nourished. No acute distress. Awake alert and oriented 3. Vital signs reviewed  Right foot: Mild pes planus with standing. She has tenderness to palpation at the insertion of the posterior tibialis tendon on the navicular. No soft tissue swelling. Neurovascularly intact distally. Walking with a slight limp.  MRI of the right foot is available for review. She has changes in the distal posterior tibialis tendon consistent with posterior tibialis tendinopathy with concomitant bony edema in the navicular. Otherwise, unremarkable.      Assessment & Plan:   Chronic insertional posterior tibialis tendinopathy, right  foot Pes planus  Custom orthotics were constructed for her today. They were comfortable prior to leaving the office. Patient will return to the office in 4 weeks for reevaluation. We discussed the possibility of ultrasound evaluation and starting topical nitroglycerin at her follow-up visit if she is still struggling. She will talk with her physical therapist about weaning to a home exercise program. I will make sure at her follow-up visit that she is doing appropriate eccentric strengthening for her posterior tibialis tendon. I explained to the patient that this is a chronic problem that may take several months before resolving. She understands. I've also given her an arch strap to wear at work.  Total time spent with the patient was 30 minutes with greater than 50% of the time spent in face-to-face consultation discussing orthotic construction, instruction, and fitting as well as discussing her MRI findings and potential future treatment.  Patient was fitted for a : standard, cushioned, semi-rigid orthotic. The orthotic was heated and afterward the patient stood on the orthotic blank positioned on the orthotic stand. The patient was positioned in subtalar neutral position and 10 degrees of ankle dorsiflexion in a weight bearing stance. After completion of molding, a stable base was applied to the orthotic blank. The blank was ground to a stable position for weight bearing. Size: 7 Base: Blue EVA Posting: none Additional orthotic padding: none

## 2016-10-11 ENCOUNTER — Encounter: Payer: Self-pay | Admitting: Sports Medicine

## 2016-10-11 ENCOUNTER — Ambulatory Visit (INDEPENDENT_AMBULATORY_CARE_PROVIDER_SITE_OTHER): Payer: BLUE CROSS/BLUE SHIELD | Admitting: Sports Medicine

## 2016-10-11 VITALS — BP 124/89 | Ht 67.0 in | Wt 202.0 lb

## 2016-10-11 DIAGNOSIS — M76821 Posterior tibial tendinitis, right leg: Secondary | ICD-10-CM | POA: Insufficient documentation

## 2016-10-11 MED ORDER — NITROGLYCERIN 0.2 MG/HR TD PT24: MEDICATED_PATCH | TRANSDERMAL | 1 refills | Status: DC

## 2016-10-11 NOTE — Patient Instructions (Signed)

## 2016-10-11 NOTE — Assessment & Plan Note (Addendum)
-   Evidence of partial tear on [place], not noted on previous MRI. - Recommended trying nitroglycerin patches to encourage healing. Was counseled on risk of migraine. - Reviewed ROM exercises, including "ABCs" and towel grabbing with toes and encouraged doing regularly. - Offered to consult surgery and to write letter to patient's work limiting activity/number of shifts but she prefers to only consider this at present - Will continue to monitor degree of swelling and partial tear of tendon with serial [place] at follow-up

## 2016-10-11 NOTE — Progress Notes (Signed)
[facility] Progress Note  Subjective:  Kiara Klein is a 31 y.o. female who presents for follow-up of R ankle pain. She was found to have insertional peroneal tendinopathy by MRI 07/29/16. She was seen at [facility] about 1 month ago and had custom orthotics made for pes planus and treatment options for her tendinopathy were discussed. She says ankle pain is "back at point A," but orthotics do make walking more comfortable. She has been dealing with this issue since 2016. Had previously worn Cam Walker. 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 She has noticed her foot pain is a lot worse after working shifts for the assisted living facility. Diclofenac helps pain some. Has not noticed any swelling. Denies any inciting injury.  ROS: No falls, no rashes; does get headaches but no aura and respond to tylenol/ibuprofen  Objective: Blood pressure 124/89, height  (1.702 m), weight 202 lb (91.6 kg). Body mass index is 31.64 kg/m. Constitutional: Very pleasant, overweight female in NAD Pulmonary/Chest: No respiratory distress.   Musculoskeletal: Walks with limp, favoring L foot. No swelling of R compared to L foot. Point tenderness to palpation just below R medial malleolus over navicular bone. FROM and strength with inversion, eversion, plantar and dorsi flexion of feet bilaterally--though dorsiflexion and resisted inversion particularly painful on R. Normal anterior drawer and negative talar tilt of bilateral ankles.  Neurological: Sensation intact over feet bilaterally.  Skin: Skin is warm and dry. No rash noted. Star tattoos across R forefoot. Psychiatric: Normal mood and affect.  Vitals reviewed  [place] R foot 10/11/16: Hypoechoic fluid noted within deep fibers of posterior tibialis tendon just proximal to navicular insertion.  Assessment/Plan: Tibialis tendinitis  of right lower extremity - Evidence of partial tear on [place], not noted on previous MRI. - Recommended trying nitroglycerin patches to encourage healing. Was counseled on risk of migraine. - Reviewed ROM exercises, including "ABCs" and towel grabbing with toes and encouraged doing regularly. - Offered to consult surgery and to write letter to patient's work limiting activity/number of shifts but she prefers to only consider this at present - Will continue to monitor degree of swelling and partial tear of tendon with serial [place] at follow-up  Follow-up 1 month.  Dani Gobble, MD [facility], PGY-2  Patient seen and evaluated with the resident. I agree with the above plan of care. Today's ultrasound does show some hypoechoic changes to the deep fibers of the posterior tibialis tendon just proximal to the navicular. These findings are consistent with a partial tear of the tendon in this area. We will try topical nitroglycerin and she'll continue with her home exercises. I did discuss modifying her work schedule as well as offering her a surgical consultation she would like to wait on both of those for now. We will reevaluate in one month.

## 2016-11-10 ENCOUNTER — Encounter: Payer: Self-pay | Admitting: Sports Medicine

## 2016-11-10 ENCOUNTER — Ambulatory Visit (INDEPENDENT_AMBULATORY_CARE_PROVIDER_SITE_OTHER): Payer: BLUE CROSS/BLUE SHIELD | Admitting: Sports Medicine

## 2016-11-10 VITALS — BP 127/94 | Ht 67.0 in | Wt 202.0 lb

## 2016-11-10 DIAGNOSIS — M76821 Posterior tibial tendinitis, right leg: Secondary | ICD-10-CM | POA: Diagnosis not present

## 2016-11-10 NOTE — Progress Notes (Signed)
   Subjective:    Patient ID: Lona MillardKrystal D Mathena, female    DOB: 09/19/1985, 31 y.o.   MRN: [id number]  HPI   Grover CanavanKrystal comes in today for follow-up on right foot insertional posterior tibialis tendinopathy. Overall, she notes about 50% improvement in her pain. She is wearing her custom orthotics. She is doing her home exercises. She does get occasional headaches with her nitroglycerin patches so she is using them intermittently. She has not noticed any swelling recently. Her limping has improved as well.    Review of Systems    as above Objective:   Physical Exam  Well-developed, well-nourished. No acute distress  Right foot: Patient still has tenderness to palpation at the insertion of the posterior tibialis tendon onto the navicular. There is no soft tissue swelling. No other bony or soft tissue tenderness to direct palpation. Neurovascularly intact distally. Patient is walking today with a minimal limp.    MSK ultrasound of the right foot was performed. Limited images were obtained. There is still a slight area of hypoechoic change in the distal posterior tibialis tendon at the insertion onto the navicular. It does appear to be less obvious than on her previous scan. Findings are consistent with improving posterior tibialis tendinopathy.        Assessment & Plan:Right foot pain secondary to insertional posterior tibialis tendinopathy   Patient's symptoms are improving albeit slowly. We are going to stay with the current plan of treatment including topical nitroglycerin patches (which she is using sparingly due to headache), home exercises, and custom orthotics. She'll follow-up with me again in 6 weeks for reevaluation and repeat ultrasound. Call with questions or concerns in the interim.

## 2016-12-09 ENCOUNTER — Emergency Department (HOSPITAL_COMMUNITY): Payer: BLUE CROSS/BLUE SHIELD

## 2016-12-09 ENCOUNTER — Encounter (HOSPITAL_COMMUNITY): Payer: Self-pay | Admitting: Emergency Medicine

## 2016-12-09 DIAGNOSIS — Z79899 Other long term (current) drug therapy: Secondary | ICD-10-CM | POA: Insufficient documentation

## 2016-12-09 DIAGNOSIS — R0789 Other chest pain: Secondary | ICD-10-CM | POA: Insufficient documentation

## 2016-12-09 DIAGNOSIS — M25512 Pain in left shoulder: Secondary | ICD-10-CM | POA: Insufficient documentation

## 2016-12-09 LAB — CBC
HCT: 41.8 % (ref 36.0–46.0)
Hemoglobin: 14.6 g/dL (ref 12.0–15.0)
MCH: 28.6 pg (ref 26.0–34.0)
MCHC: 34.9 g/dL (ref 30.0–36.0)
MCV: 81.8 fL (ref 78.0–100.0)
PLATELETS: 212 10*3/uL (ref 150–400)
RBC: 5.11 MIL/uL (ref 3.87–5.11)
RDW: 13.3 % (ref 11.5–15.5)
WBC: 7.7 10*3/uL (ref 4.0–10.5)

## 2016-12-09 LAB — BASIC METABOLIC PANEL
Anion gap: 8 (ref 5–15)
BUN: 19 mg/dL (ref 6–20)
CALCIUM: 9 mg/dL (ref 8.9–10.3)
CO2: 24 mmol/L (ref 22–32)
CREATININE: 1.09 mg/dL — AB (ref 0.44–1.00)
Chloride: 108 mmol/L (ref 101–111)
GFR calc non Af Amer: >60 mL/min (ref >60)
Glucose, Bld: 93 mg/dL (ref 65–99)
Potassium: 3.7 mmol/L (ref 3.5–5.1)
SODIUM: 140 mmol/L (ref 135–145)

## 2016-12-09 LAB — POCT I-STAT TROPONIN I: TROPONIN I, POC: 0 ng/mL (ref 0.00–0.08)

## 2016-12-09 LAB — I-STAT BETA HCG BLOOD, ED (NOT ORDERABLE): I-stat hCG, quantitative: <5 m[IU]/mL (ref <5)

## 2016-12-09 NOTE — ED Triage Notes (Signed)
Pt reports having left arm pain and chest pain. Pt states arm pain has been present for the last 3 weeks and chest tightness began today. Pt states pain is under left breast.

## 2016-12-09 NOTE — ED Notes (Addendum)
Pt c/o discomfort in the left arm from the shoulder down to the fingertips onset a few weeks, and starting today stated tightness in the chest, but pointed to the LUQ when stating chest tightness.

## 2016-12-10 ENCOUNTER — Other Ambulatory Visit: Payer: Self-pay

## 2016-12-10 ENCOUNTER — Emergency Department (HOSPITAL_COMMUNITY)
Admission: EM | Admit: 2016-12-10 | Discharge: 2016-12-10 | Disposition: A | Discharge status: Home / Self Care | Payer: BLUE CROSS/BLUE SHIELD | Attending: Emergency Medicine | Admitting: Emergency Medicine

## 2016-12-10 DIAGNOSIS — M79602 Pain in left arm: Secondary | ICD-10-CM

## 2016-12-10 DIAGNOSIS — R079 Chest pain, unspecified: Secondary | ICD-10-CM

## 2016-12-10 LAB — POCT I-STAT TROPONIN I: Troponin i, poc: 0 ng/mL (ref 0.00–0.08)

## 2016-12-10 MED ORDER — NAPROXEN 375 MG PO TABS: 375.0000 mg | ORAL_TABLET | Freq: Two times a day (BID) | ORAL | 0 refills | Status: DC

## 2016-12-10 NOTE — Discharge Instructions (Signed)
Take naproxen for aching type pain as directed. Follow up with your PCP for recheck this week. Return to the emergency department if symptoms worsen.

## 2016-12-10 NOTE — ED Provider Notes (Signed)
[facility]-EMERGENCY DEPT Provider Note   CSN: [id number] Arrival date & time: 12/09/16  2155     History   Chief Complaint Chief Complaint  Patient presents with  . Chest Pain  . Arm Pain    HPI Kiara Klein is a 31 y.o. female.  Patient presents with chest tightness that started while at work yesterday (12/09/16) and was intermittent through the day with episodes lasting less than one minute. No SOB, nausea, vomiting or diaphoresis. No recent fever or cough. She reports 3 weeks of aching type pain in her left shoulder that remains constant.    The history is provided by the patient. No language interpreter was used.  Chest Pain   Pertinent negatives include no diaphoresis, no fever, no nausea and no shortness of breath.  Arm Pain  Associated symptoms include chest pain. Pertinent negatives include no shortness of breath.    History reviewed. No pertinent past medical history.  Patient Active Problem List   Diagnosis Date Noted  . Tibialis tendinitis of right lower extremity 10/11/2016    History reviewed. No pertinent surgical history.  OB History    No data available       Home Medications    Prior to Admission medications   Medication Sig Start Date End Date Taking? Authorizing Provider  medroxyPROGESTERone (DEPO-PROVERA) 150 MG/ML injection Inject 150 mg into the muscle every 3 (three) months.   Yes [provider]  nitroGLYCERIN (NITRODUR - DOSED IN MG/24 HR) 0.2 mg/hr patch Use 1/4 patch daily to the affected area Patient not taking: Reported on 12/10/2016 10/11/16   Ralene Cork, DO    Family History History reviewed. No pertinent family history.  Social History Social History  Substance Use Topics  . Smoking status: Never Smoker  . Smokeless tobacco: Never Used  . Alcohol use 0.0 oz/week     Allergies   Bactrim [sulfamethoxazole-trimethoprim] and Penicillins   Review of Systems Review of Systems  Constitutional: Negative  for chills, diaphoresis and fever.  HENT: Negative.   Respiratory: Negative.  Negative for shortness of breath.   Cardiovascular: Positive for chest pain.  Gastrointestinal: Negative.  Negative for nausea.  Musculoskeletal:       Left shoulder and arm pain.  Skin: Negative.   Neurological: Negative.      Physical Exam Updated Vital Signs BP (!) 123/100   Pulse 74   Temp 99.4 F (37.4 C) (Oral)   Resp 14   Ht 5\' 7"  (1.702 m)   Wt 89.4 kg (197 lb)   SpO2 100%   BMI 30.85 kg/m   Physical Exam  Constitutional: She is oriented to person, place, and time. She appears well-developed and well-nourished.  HENT:  Head: Normocephalic.  Neck: Normal range of motion. Neck supple.  Cardiovascular: Normal rate, regular rhythm and intact distal pulses.   Pulmonary/Chest: Effort normal and breath sounds normal.  Abdominal: Soft. Bowel sounds are normal. There is no tenderness. There is no rebound and no guarding.  Musculoskeletal: Normal range of motion.  Full, pain free ROM of left arm and shoulder. There is mild tenderness to superior portion of the scapula that reproduces her symptoms of left arm discomfort.   Neurological: She is alert and oriented to person, place, and time.  Skin: Skin is warm and dry. No rash noted.  Psychiatric: She has a normal mood and affect.     ED Treatments / Results  Labs (all labs ordered are listed, but only abnormal results are displayed)  Labs Reviewed  BASIC METABOLIC PANEL - Abnormal; Notable for the following:       Result Value   Creatinine, Ser 1.09 (*)    All other components within normal limits  CBC  I-STAT TROPOININ, ED  I-STAT BETA HCG BLOOD, ED ([facility], [facility], [facility] ONLY)  POCT I-STAT TROPONIN I  I-STAT BETA HCG BLOOD, ED (NOT ORDERABLE)  I-STAT TROPOININ, ED   Results for orders placed or performed during the hospital encounter of 12/10/16  Basic metabolic panel  Result Value Ref Range   Sodium 140 135 - 145 mmol/L   Potassium 3.7 3.5 -  5.1 mmol/L   Chloride 108 101 - 111 mmol/L   CO2 24 22 - 32 mmol/L   Glucose, Bld 93 65 - 99 mg/dL   BUN 19 6 - 20 mg/dL   Creatinine, Ser 2.951.09 (H) 0.44 - 1.00 mg/dL   Calcium 9.0 8.9 - 28.410.3 mg/dL   GFR calc non Af Amer >60 >60 mL/min   GFR calc Af Amer >60 >60 mL/min   Anion gap 8 5 - 15  CBC  Result Value Ref Range   WBC 7.7 4.0 - 10.5 K/uL   RBC 5.11 3.87 - 5.11 MIL/uL   Hemoglobin 14.6 12.0 - 15.0 g/dL   HCT 13.241.8 44.036.0 - 10.246.0 %   MCV 81.8 78.0 - 100.0 fL   MCH 28.6 26.0 - 34.0 pg   MCHC 34.9 30.0 - 36.0 g/dL   RDW 72.513.3 36.611.5 - 44.015.5 %   Platelets 212 150 - 400 K/uL  POCT i-Stat troponin I  Result Value Ref Range   Troponin i, poc 0.00 0.00 - 0.08 ng/mL   Comment 3          I-Stat beta hCG blood, ED  Result Value Ref Range   I-stat hCG, quantitative <5.0 <5 mIU/mL   Comment 3             EKG  EKG Interpretation  Date/Time:  Thursday December 09 2016 22:36:08 EDT Ventricular Rate:  83 PR Interval:    QRS Duration: 86 QT Interval:  360 QTC Calculation: 423 R Axis:   58 Text Interpretation:  Sinus rhythm T-wave inversion is V3 not seen previously Confirmed by Paula LibraMolpus, John ([id number]) on 12/09/2016 10:48:44 PM       Radiology Dg Chest 2 View  Result Date: 12/09/2016 CLINICAL DATA:  Initial evaluation for acute chest pain. EXAM: CHEST  2 VIEW COMPARISON:  Prior radiograph from 04/29/2010. FINDINGS: The cardiac and mediastinal silhouettes are stable in size and contour, and remain within normal limits. The lungs are normally inflated. No airspace consolidation, pleural effusion, or pulmonary edema is identified. There is no pneumothorax. No acute osseous abnormality identified. IMPRESSION: No active cardiopulmonary disease. Electronically Signed   By: Rise MuBenjamin  McClintock M.D.   On: 12/09/2016 23:04    Procedures Procedures (including critical care time)  Medications Ordered in ED Medications - No data to display   Initial Impression / Assessment and Plan / ED Course  I  have reviewed the triage vital signs and the nursing notes.  Pertinent labs & imaging results that were available during my care of the patient were reviewed by me and considered in my medical decision making (see chart for details).     Patient with chest discomfort x 1 day and left arm ache x 3 weeks. She has a Heart Score of 2 given family history of CAD (Mom, in her 3030's) and nonspecific EKG change from previous.  Troponin and delta trop negative. EKG unchanged here. She can be discharged home and is encouraged to follow up with her primary care doctor for recheck this week. Return precautions discussed.   Final Clinical Impressions(s) / ED Diagnoses   Final diagnoses:  None   1. Nonspecific chest pain 2. Left shoulder pain  New Prescriptions New Prescriptions   No medications on file     Danne Harbor 12/10/16 0323    Molpus, Jonny Ruiz, MD 12/10/16 [phone]

## 2016-12-10 NOTE — ED Notes (Signed)
EKG given to EDP,Molpus,MD., for review. 

## 2016-12-21 ENCOUNTER — Ambulatory Visit: Payer: BLUE CROSS/BLUE SHIELD | Admitting: Sports Medicine

## 2017-01-25 ENCOUNTER — Ambulatory Visit (INDEPENDENT_AMBULATORY_CARE_PROVIDER_SITE_OTHER): Payer: BLUE CROSS/BLUE SHIELD | Admitting: Sports Medicine

## 2017-01-25 VITALS — BP 128/80 | Ht 67.0 in | Wt 204.0 lb

## 2017-01-25 DIAGNOSIS — M76821 Posterior tibial tendinitis, right leg: Secondary | ICD-10-CM

## 2017-01-25 NOTE — Progress Notes (Signed)
   Subjective:    Patient ID: Lona MillardKrystal D Ivy, female    DOB: 10/04/1985, 31 y.o.   MRN: [id number]  HPI   Patient comes in today for follow-up on insertional posterior tibialis tendinopathy of her right foot. Symptoms persist despite 2 years of conservative treatment including custom orthotics, physical therapy, topical nitroglycerin, and an aggressive eccentric strengthening home exercise program. She still has significant pain with activity.    Review of Systems As above    Objective:   Physical Exam  Well-developed, well-nourished. No acute distress  Right foot: Patient remains tender to palpation at the navicular, specifically at the insertion of the posterior tibialis tendon onto the bone. No significant soft tissue swelling today. Reproducible pain with resisted posterior tibialis tendon contraction. Neurovascularly intact distally. Walking with a slight limp.      Assessment & Plan:   Right foot pain secondary to chronic insertional posterior tibialis tendinopathy  Patient has had 2 years of symptoms which have been unresponsive to conservative treatment. I would like to refer her to Dr. Victorino DikeHewitt for his input. I'll defer further workup and treatment to the discretion of Dr. Victorino DikeHewitt and the patient will follow-up with me as needed.

## 2017-02-01 ENCOUNTER — Other Ambulatory Visit: Payer: Self-pay

## 2017-02-01 DIAGNOSIS — M76821 Posterior tibial tendinitis, right leg: Secondary | ICD-10-CM

## 2017-10-11 ENCOUNTER — Emergency Department (HOSPITAL_COMMUNITY)
Admission: EM | Admit: 2017-10-11 | Discharge: 2017-10-11 | Discharge status: Against Medical Advice | Payer: BLUE CROSS/BLUE SHIELD

## 2019-02-25 ENCOUNTER — Other Ambulatory Visit: Payer: Self-pay

## 2019-02-25 ENCOUNTER — Encounter (HOSPITAL_COMMUNITY): Payer: Self-pay

## 2019-02-25 ENCOUNTER — Emergency Department (HOSPITAL_COMMUNITY)
Admission: EM | Admit: 2019-02-25 | Discharge: 2019-02-25 | Disposition: A | Discharge status: Home / Self Care | Payer: BC Managed Care – PPO | Attending: Emergency Medicine | Admitting: Emergency Medicine

## 2019-02-25 ENCOUNTER — Emergency Department (HOSPITAL_COMMUNITY): Payer: BC Managed Care – PPO

## 2019-02-25 DIAGNOSIS — Z7982 Long term (current) use of aspirin: Secondary | ICD-10-CM | POA: Diagnosis not present

## 2019-02-25 DIAGNOSIS — I10 Essential (primary) hypertension: Secondary | ICD-10-CM | POA: Diagnosis not present

## 2019-02-25 DIAGNOSIS — R079 Chest pain, unspecified: Secondary | ICD-10-CM | POA: Insufficient documentation

## 2019-02-25 HISTORY — DX: Essential (primary) hypertension: I10

## 2019-02-25 LAB — COMPREHENSIVE METABOLIC PANEL
ALT: 21 U/L (ref 0–44)
AST: 26 U/L (ref 15–41)
Albumin: 3.7 g/dL (ref 3.5–5.0)
Alkaline Phosphatase: 89 U/L (ref 38–126)
Anion gap: 8 (ref 5–15)
BUN: 8 mg/dL (ref 6–20)
CO2: 23 mmol/L (ref 22–32)
Calcium: 9 mg/dL (ref 8.9–10.3)
Chloride: 107 mmol/L (ref 98–111)
Creatinine, Ser: 0.89 mg/dL (ref 0.44–1.00)
GFR calc Af Amer: >60 mL/min (ref >60)
GFR calc non Af Amer: >60 mL/min (ref >60)
Glucose, Bld: 85 mg/dL (ref 70–99)
Potassium: 3.7 mmol/L (ref 3.5–5.1)
Sodium: 138 mmol/L (ref 135–145)
Total Bilirubin: 1.1 mg/dL (ref 0.3–1.2)
Total Protein: 7.3 g/dL (ref 6.5–8.1)

## 2019-02-25 LAB — URINALYSIS, ROUTINE W REFLEX MICROSCOPIC
Bilirubin Urine: NEGATIVE
Glucose, UA: NEGATIVE mg/dL
Hgb urine dipstick: NEGATIVE
Ketones, ur: NEGATIVE mg/dL
Nitrite: NEGATIVE
Protein, ur: NEGATIVE mg/dL
Specific Gravity, Urine: 1.008 (ref 1.005–1.030)
pH: 8 (ref 5.0–8.0)

## 2019-02-25 LAB — I-STAT BETA HCG BLOOD, ED (MC, WL, AP ONLY): I-stat hCG, quantitative: <5 m[IU]/mL (ref <5)

## 2019-02-25 LAB — TROPONIN I (HIGH SENSITIVITY)
Troponin I (High Sensitivity): 4 ng/L (ref <18)
Troponin I (High Sensitivity): 3 ng/L (ref <18)

## 2019-02-25 MED ORDER — OXYCODONE-ACETAMINOPHEN 5-325 MG PO TABS
2.0000 | ORAL_TABLET | Freq: Once | ORAL | Status: AC
  Administered 2019-02-25: 15:00:00 2 via ORAL
  Filled 2019-02-25: qty 2

## 2019-02-25 NOTE — ED Triage Notes (Signed)
EMS brought the patient in from work with a complaint of chest pain. The patient had vitals that were within normal limits with EMS. The patient is stable at this time, being connected to the monitor

## 2019-02-25 NOTE — ED Provider Notes (Signed)
[facility] EMERGENCY DEPARTMENT Provider Note   CSN: [id number] Arrival date & time: 02/25/19  1348     History   Chief Complaint Chief Complaint  Patient presents with  . Chest Pain    HPI Kiara Klein is a 33 y.o. female.     HPI   Presents today via EMS with reports that she has having sharp anterior chest pain.  Patient states this began today while she was at work.  It is sharp in nature.  It is worse with palpation and movement.  She feels deep breathing but does not feel short of breath.  She has a history of hypertension but has not been taking her antihypertensives.  She states that she has had some blood work abdominal cramping for the past few days.  She was seen in urgent care yesterday and had a urinalysis obtained that was negative.  States that time she had some left shoulder blade pain.  States she describes pain initially as sharp nurses and is also states that it was tight.  She denies any abnormal vaginal discharge.  States she has not been sexually active for over 6 months.  Past Medical History:  Diagnosis Date  . HTN (hypertension)     Patient Active Problem List   Diagnosis Date Noted  . Tibialis tendinitis of right lower extremity 10/11/2016    No past surgical history on file.   OB History   No obstetric history on file.      Home Medications    Prior to Admission medications   Medication Sig Start Date End Date Taking? Authorizing Provider  aspirin 81 MG chewable tablet Chew 325 mg by mouth once.   Yes [provider]  ibuprofen (ADVIL) 200 MG tablet Take 800 mg by mouth every 6 (six) hours as needed for moderate pain.   Yes [provider]  naproxen (NAPROSYN) 375 MG tablet Take 1 tablet (375 mg total) by mouth 2 (two) times daily. Patient not taking: Reported on 02/25/2019 12/10/16   Charlann Lange, PA-C    Family History No family history on file.  Social History Social History   Tobacco  Use  . Smoking status: Never Smoker  . Smokeless tobacco: Never Used  Substance Use Topics  . Alcohol use: Yes    Alcohol/week: 0.0 standard drinks  . Drug use: No     Allergies   Bactrim [sulfamethoxazole-trimethoprim] and Penicillins   Review of Systems Review of Systems  All other systems reviewed and are negative.    Physical Exam Updated Vital Signs BP (!) 119/93   Pulse 77   Temp 98.7 F (37.1 C) (Oral)   Resp 13   Ht 1.676 m (5\' 6" )   Wt 94.8 kg   LMP 02/03/2019 (Approximate)   SpO2 96%   BMI 33.73 kg/m   Physical Exam Vitals signs and nursing note reviewed.  Constitutional:      General: She is in acute distress.     Appearance: She is well-developed.  HENT:     Head: Normocephalic.  Eyes:     Pupils: Pupils are equal, round, and reactive to light.  Neck:     Musculoskeletal: Normal range of motion.  Cardiovascular:     Rate and Rhythm: Normal rate and regular rhythm.     Heart sounds: Normal heart sounds.  Pulmonary:     Effort: Pulmonary effort is normal. No tachypnea.     Breath sounds: Normal breath sounds.  Chest:  Chest wall: Tenderness present.  Abdominal:     General: Bowel sounds are normal.     Palpations: Abdomen is soft.     Tenderness: There is no abdominal tenderness.  Musculoskeletal: Normal range of motion.  Skin:    General: Skin is warm and dry.     Capillary Refill: Capillary refill takes less than 2 seconds.  Neurological:     General: No focal deficit present.     Mental Status: She is alert.      ED Treatments / Results  Labs (all labs ordered are listed, but only abnormal results are displayed) Labs Reviewed  URINALYSIS, ROUTINE W REFLEX MICROSCOPIC - Abnormal; Notable for the following components:      Result Value   APPearance HAZY (*)    Leukocytes,Ua MODERATE (*)    Bacteria, UA MANY (*)    All other components within normal limits  COMPREHENSIVE METABOLIC PANEL  I-STAT BETA HCG BLOOD, ED ([facility], [facility], [facility]  ONLY)  TROPONIN I (HIGH SENSITIVITY)  TROPONIN I (HIGH SENSITIVITY)    EKG None ED ECG REPORT   Date: 02/25/2019  Rate: 83  Rhythm: normal sinus rhythm  QRS Axis: normal  Intervals: normal  ST/T Wave abnormalities: normal  Conduction Disutrbances:none  Narrative Interpretation:   Old EKG Reviewed: none available  I have personally reviewed the EKG tracing and agree with the computerized printout as noted.  Radiology Dg Chest Port 1 View  Result Date: 02/25/2019 CLINICAL DATA:  Chest pain.  Hypertension. EXAM: PORTABLE CHEST 1 VIEW COMPARISON:  12/09/2016 FINDINGS: The heart size and mediastinal contours are within normal limits. Both lungs are clear. The visualized skeletal structures are unremarkable. IMPRESSION: No active disease. Electronically Signed   By: Signa Kellaylor  Stroud M.D.   On: 02/25/2019 14:26    Procedures Procedures (including critical care time)  Medications Ordered in ED Medications  oxyCODONE-acetaminophen (PERCOCET/ROXICET) 5-325 MG per tablet 2 tablet (2 tablets Oral Given 02/25/19 1512)     Initial Impression / Assessment and Plan / ED Course  I have reviewed the triage vital signs and the nursing notes.  Pertinent labs & imaging results that were available during my care of the patient were reviewed by me and considered in my medical decision making (see chart for details).      EKG normal.  Heart score 1.  Troponin and delta troponin normal.  Low concern for pulmonary embolism with normal heart rate and no dyspnea.  Patient's pain appears to be musculoskeletal.  However, she is advised of return precautions and need for follow-up and voices understanding.    Final Clinical Impressions(s) / ED Diagnoses   Final diagnoses:  Chest pain, unspecified type    ED Discharge Orders    None       Margarita Grizzleay, Adan Beal, MD 02/25/19 [phone]

## 2019-02-25 NOTE — ED Notes (Signed)
Patient states she was at an urgent care or lower abdominal and says she feels like "everything wants to fall out." The patient also stated that she felt pain radiating to her shoulder blade at that time, but today only chest tightness and dizziness at the onset.

## 2019-02-25 NOTE — ED Notes (Signed)
signiture pad unavailable at time of d/c. Pt verbalized understanding.

## 2019-07-05 ENCOUNTER — Emergency Department (HOSPITAL_COMMUNITY): Payer: BC Managed Care – PPO

## 2019-07-05 ENCOUNTER — Other Ambulatory Visit: Payer: Self-pay

## 2019-07-05 DIAGNOSIS — Z5321 Procedure and treatment not carried out due to patient leaving prior to being seen by health care provider: Secondary | ICD-10-CM | POA: Insufficient documentation

## 2019-07-05 DIAGNOSIS — R0789 Other chest pain: Secondary | ICD-10-CM | POA: Diagnosis present

## 2019-07-05 LAB — CBC
HCT: 44.7 % (ref 36.0–46.0)
Hemoglobin: 14.7 g/dL (ref 12.0–15.0)
MCH: 28.6 pg (ref 26.0–34.0)
MCHC: 32.9 g/dL (ref 30.0–36.0)
MCV: 87 fL (ref 80.0–100.0)
Platelets: 218 10*3/uL (ref 150–400)
RBC: 5.14 MIL/uL — ABNORMAL HIGH (ref 3.87–5.11)
RDW: 13.5 % (ref 11.5–15.5)
WBC: 6.5 10*3/uL (ref 4.0–10.5)
nRBC: 0 % (ref 0.0–0.2)

## 2019-07-05 LAB — BASIC METABOLIC PANEL
Anion gap: 7 (ref 5–15)
BUN: 12 mg/dL (ref 6–20)
CO2: 23 mmol/L (ref 22–32)
Calcium: 8.9 mg/dL (ref 8.9–10.3)
Chloride: 109 mmol/L (ref 98–111)
Creatinine, Ser: 0.79 mg/dL (ref 0.44–1.00)
GFR calc Af Amer: >60 mL/min (ref >60)
GFR calc non Af Amer: >60 mL/min (ref >60)
Glucose, Bld: 98 mg/dL (ref 70–99)
Potassium: 3.3 mmol/L — ABNORMAL LOW (ref 3.5–5.1)
Sodium: 139 mmol/L (ref 135–145)

## 2019-07-05 LAB — TROPONIN I (HIGH SENSITIVITY): Troponin I (High Sensitivity): <2 ng/L (ref <18)

## 2019-07-05 LAB — I-STAT BETA HCG BLOOD, ED (NOT ORDERABLE): I-stat hCG, quantitative: <5 m[IU]/mL (ref <5)

## 2019-07-05 MED ORDER — SODIUM CHLORIDE 0.9% FLUSH: 3.0000 mL | Freq: Once | INTRAVENOUS | Status: DC

## 2019-07-05 NOTE — ED Triage Notes (Signed)
Pt  Reports chest pain onset 07/04/19 but got worse today. 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 Felt pain  yesterday after moving large pt's 2 days prior and pain worsened today

## 2019-07-06 ENCOUNTER — Emergency Department (HOSPITAL_COMMUNITY)
Admission: EM | Admit: 2019-07-06 | Discharge: 2019-07-06 | Disposition: A | Discharge status: Home / Self Care | Payer: BC Managed Care – PPO | Attending: Emergency Medicine | Admitting: Emergency Medicine

## 2020-02-06 ENCOUNTER — Other Ambulatory Visit: Payer: Self-pay

## 2020-02-06 ENCOUNTER — Encounter (HOSPITAL_BASED_OUTPATIENT_CLINIC_OR_DEPARTMENT_OTHER): Payer: Self-pay

## 2020-02-06 ENCOUNTER — Emergency Department (HOSPITAL_BASED_OUTPATIENT_CLINIC_OR_DEPARTMENT_OTHER)
Admission: EM | Admit: 2020-02-06 | Discharge: 2020-02-06 | Disposition: A | Discharge status: Home / Self Care | Payer: BC Managed Care – PPO | Attending: Emergency Medicine | Admitting: Emergency Medicine

## 2020-02-06 DIAGNOSIS — Z79899 Other long term (current) drug therapy: Secondary | ICD-10-CM | POA: Insufficient documentation

## 2020-02-06 DIAGNOSIS — Z7982 Long term (current) use of aspirin: Secondary | ICD-10-CM | POA: Insufficient documentation

## 2020-02-06 DIAGNOSIS — R55 Syncope and collapse: Secondary | ICD-10-CM | POA: Insufficient documentation

## 2020-02-06 DIAGNOSIS — I10 Essential (primary) hypertension: Secondary | ICD-10-CM | POA: Diagnosis not present

## 2020-02-06 LAB — URINALYSIS, ROUTINE W REFLEX MICROSCOPIC
Bilirubin Urine: NEGATIVE
Glucose, UA: NEGATIVE mg/dL
Ketones, ur: NEGATIVE mg/dL
Leukocytes,Ua: NEGATIVE
Nitrite: NEGATIVE
Protein, ur: NEGATIVE mg/dL
Specific Gravity, Urine: >1.03 — ABNORMAL HIGH (ref 1.005–1.030)
pH: 6 (ref 5.0–8.0)

## 2020-02-06 LAB — BASIC METABOLIC PANEL
Anion gap: 8 (ref 5–15)
BUN: 9 mg/dL (ref 6–20)
CO2: 25 mmol/L (ref 22–32)
Calcium: 8.8 mg/dL — ABNORMAL LOW (ref 8.9–10.3)
Chloride: 105 mmol/L (ref 98–111)
Creatinine, Ser: 0.87 mg/dL (ref 0.44–1.00)
GFR calc Af Amer: >60 mL/min (ref >60)
GFR calc non Af Amer: >60 mL/min (ref >60)
Glucose, Bld: 91 mg/dL (ref 70–99)
Potassium: 3.3 mmol/L — ABNORMAL LOW (ref 3.5–5.1)
Sodium: 138 mmol/L (ref 135–145)

## 2020-02-06 LAB — CBC
HCT: 45 % (ref 36.0–46.0)
Hemoglobin: 14.7 g/dL (ref 12.0–15.0)
MCH: 28.6 pg (ref 26.0–34.0)
MCHC: 32.7 g/dL (ref 30.0–36.0)
MCV: 87.5 fL (ref 80.0–100.0)
Platelets: 231 10*3/uL (ref 150–400)
RBC: 5.14 MIL/uL — ABNORMAL HIGH (ref 3.87–5.11)
RDW: 13.3 % (ref 11.5–15.5)
WBC: 4.6 10*3/uL (ref 4.0–10.5)
nRBC: 0 % (ref 0.0–0.2)

## 2020-02-06 LAB — URINALYSIS, MICROSCOPIC (REFLEX)

## 2020-02-06 LAB — PREGNANCY, URINE: Preg Test, Ur: NEGATIVE

## 2020-02-06 MED ORDER — HYDROCHLOROTHIAZIDE 25 MG PO TABS: 25.0000 mg | ORAL_TABLET | Freq: Every day | ORAL | 2 refills | Status: DC

## 2020-02-06 NOTE — ED Triage Notes (Signed)
Pt c/o "feeling faint-lightheaded-feel jittery-legs are restless" x 6 days-denies pain-NAD-steady gait

## 2020-02-13 NOTE — ED Provider Notes (Signed)
[facility] EMERGENCY DEPARTMENT Provider Note   CSN: [id number] Arrival date & time: 02/06/20  1107     History Chief Complaint  Patient presents with  . Near Syncope    Kiara Klein is a 34 y.o. female.  HPI   34 year old female with numerous complaints.  Feels faint/lightheaded.  She has difficulty explaining her symptoms beyond this.  She feels "jittery."  She feels like her legs are restless.  Denies any significant acute pain.  No actual syncopal events.  Denies significant caffeine usage.  No new medications or over-the-counter's.  Past Medical History:  Diagnosis Date  . HTN (hypertension)     Patient Active Problem List   Diagnosis Date Noted  . Tibialis tendinitis of right lower extremity 10/11/2016    History reviewed. No pertinent surgical history.   OB History   No obstetric history on file.     No family history on file.  Social History   Tobacco Use  . Smoking status: Never Smoker  . Smokeless tobacco: Never Used  Vaping Use  . Vaping Use: Never used  Substance Use Topics  . Alcohol use: Yes    Alcohol/week: 0.0 standard drinks    Comment: occ  . Drug use: No    Home Medications Prior to Admission medications   Medication Sig Start Date End Date Taking? Authorizing Provider  aspirin 81 MG chewable tablet Chew 325 mg by mouth once.    [provider]  hydrochlorothiazide (HYDRODIURIL) 25 MG tablet Take 1 tablet (25 mg total) by mouth daily. 02/06/20   Raeford Razor, MD  ibuprofen (ADVIL) 200 MG tablet Take 800 mg by mouth every 6 (six) hours as needed for moderate pain.    [provider]  naproxen (NAPROSYN) 375 MG tablet Take 1 tablet (375 mg total) by mouth 2 (two) times daily. Patient not taking: Reported on 02/25/2019 12/10/16   Elpidio Anis, PA-C    Allergies    Bactrim [sulfamethoxazole-trimethoprim] and Penicillins  Review of Systems   Review of Systems All systems reviewed and negative,  other than as noted in HPI.  Physical Exam Updated Vital Signs BP (!) 150/102 (BP Location: Right Arm)   Pulse 76   Temp 98.5 F (36.9 C) (Oral)   Resp 16   LMP  (LMP Unknown)   SpO2 100%   Physical Exam Vitals and nursing note reviewed.  Constitutional:      General: She is not in acute distress.    Appearance: She is well-developed.  HENT:     Head: Normocephalic and atraumatic.  Eyes:     General:        Right eye: No discharge.        Left eye: No discharge.     Conjunctiva/sclera: Conjunctivae normal.  Cardiovascular:     Rate and Rhythm: Normal rate and regular rhythm.     Heart sounds: Normal heart sounds. No murmur heard.  No friction rub. No gallop.   Pulmonary:     Effort: Pulmonary effort is normal. No respiratory distress.     Breath sounds: Normal breath sounds.  Abdominal:     General: There is no distension.     Palpations: Abdomen is soft.     Tenderness: There is no abdominal tenderness.  Musculoskeletal:        General: No tenderness.     Cervical back: Neck supple.  Skin:    General: Skin is warm and dry.  Neurological:     Mental  Status: She is alert and oriented to person, place, and time.     Cranial Nerves: No cranial nerve deficit.     Sensory: No sensory deficit.     Motor: No weakness.     Coordination: Coordination normal.  Psychiatric:        Behavior: Behavior normal.        Thought Content: Thought content normal.     ED Results / Procedures / Treatments   Labs (all labs ordered are listed, but only abnormal results are displayed) Labs Reviewed  BASIC METABOLIC PANEL - Abnormal; Notable for the following components:      Result Value   Potassium 3.3 (*)    Calcium 8.8 (*)    All other components within normal limits  CBC - Abnormal; Notable for the following components:   RBC 5.14 (*)    All other components within normal limits  URINALYSIS, ROUTINE W REFLEX MICROSCOPIC - Abnormal; Notable for the following components:    Color, Urine AMBER (*)    APPearance CLOUDY (*)    Specific Gravity, Urine >1.030 (*)    Hgb urine dipstick LARGE (*)    All other components within normal limits  URINALYSIS, MICROSCOPIC (REFLEX) - Abnormal; Notable for the following components:   Bacteria, UA MANY (*)    All other components within normal limits  PREGNANCY, URINE    EKG EKG Interpretation  Date/Time:  Wednesday February 06 2020 11:29:35 EDT Ventricular Rate:  99 PR Interval:  160 QRS Duration: 78 QT Interval:  366 QTC Calculation: 469 R Axis:   58 Text Interpretation: Normal sinus rhythm with sinus arrhythmia Septal infarct , age undetermined Abnormal ECG Confirmed by Raeford Razor [phone]) on 02/06/2020 2:03:29 PM   Radiology No results found.  Procedures Procedures (including critical care time)  Medications Ordered in ED Medications - No data to display  ED Course  I have reviewed the triage vital signs and the nursing notes.  Pertinent labs & imaging results that were available during my care of the patient were reviewed by me and considered in my medical decision making (see chart for details).    MDM Rules/Calculators/A&P                          34 year old female with vague symptoms of lightheadedness.  ED work-up fairly unremarkable.  Hypertension noted.  History of the same.  Currently not on medications.Previously on HCTZ.  Prescription provided.  Needs to establish PCP follow-up.  Return precautions discussed.  Final Clinical Impression(s) / ED Diagnoses Final diagnoses:  Near syncope  Hypertension, unspecified type    Rx / DC Orders ED Discharge Orders         Ordered    hydrochlorothiazide (HYDRODIURIL) 25 MG tablet  Daily        02/06/20 1430           Raeford Razor, MD 02/13/20 1001

## 2020-03-07 ENCOUNTER — Other Ambulatory Visit: Payer: Self-pay

## 2020-03-07 ENCOUNTER — Ambulatory Visit (HOSPITAL_COMMUNITY)
Admission: EM | Admit: 2020-03-07 | Discharge: 2020-03-07 | Discharge status: Against Medical Advice | Payer: BC Managed Care – PPO | Attending: Emergency Medicine | Admitting: Emergency Medicine

## 2020-03-07 ENCOUNTER — Encounter (HOSPITAL_COMMUNITY): Payer: Self-pay | Admitting: *Deleted

## 2020-03-07 ENCOUNTER — Ambulatory Visit (HOSPITAL_COMMUNITY): Payer: BC Managed Care – PPO

## 2020-03-07 DIAGNOSIS — F419 Anxiety disorder, unspecified: Secondary | ICD-10-CM | POA: Diagnosis not present

## 2020-03-07 DIAGNOSIS — Z881 Allergy status to other antibiotic agents status: Secondary | ICD-10-CM | POA: Diagnosis not present

## 2020-03-07 DIAGNOSIS — R42 Dizziness and giddiness: Secondary | ICD-10-CM | POA: Diagnosis not present

## 2020-03-07 DIAGNOSIS — I10 Essential (primary) hypertension: Secondary | ICD-10-CM | POA: Diagnosis not present

## 2020-03-07 DIAGNOSIS — M546 Pain in thoracic spine: Secondary | ICD-10-CM | POA: Insufficient documentation

## 2020-03-07 DIAGNOSIS — R079 Chest pain, unspecified: Secondary | ICD-10-CM | POA: Insufficient documentation

## 2020-03-07 DIAGNOSIS — Z880 Allergy status to penicillin: Secondary | ICD-10-CM | POA: Diagnosis not present

## 2020-03-07 DIAGNOSIS — Z791 Long term (current) use of non-steroidal anti-inflammatories (NSAID): Secondary | ICD-10-CM | POA: Insufficient documentation

## 2020-03-07 DIAGNOSIS — Z79899 Other long term (current) drug therapy: Secondary | ICD-10-CM | POA: Insufficient documentation

## 2020-03-07 DIAGNOSIS — R531 Weakness: Secondary | ICD-10-CM | POA: Diagnosis not present

## 2020-03-07 DIAGNOSIS — Z7982 Long term (current) use of aspirin: Secondary | ICD-10-CM | POA: Insufficient documentation

## 2020-03-07 LAB — CBC WITH DIFFERENTIAL/PLATELET
Abs Immature Granulocytes: 0 10*3/uL (ref 0.00–0.07)
Basophils Absolute: 0.1 10*3/uL (ref 0.0–0.1)
Basophils Relative: 1 %
Eosinophils Absolute: 0.1 10*3/uL (ref 0.0–0.5)
Eosinophils Relative: 1 %
HCT: 50.5 % — ABNORMAL HIGH (ref 36.0–46.0)
Hemoglobin: 16.5 g/dL — ABNORMAL HIGH (ref 12.0–15.0)
Immature Granulocytes: 0 %
Lymphocytes Relative: 47 %
Lymphs Abs: 2.2 10*3/uL (ref 0.7–4.0)
MCH: 28.3 pg (ref 26.0–34.0)
MCHC: 32.7 g/dL (ref 30.0–36.0)
MCV: 86.5 fL (ref 80.0–100.0)
Monocytes Absolute: 0.3 10*3/uL (ref 0.1–1.0)
Monocytes Relative: 7 %
Neutro Abs: 2.1 10*3/uL (ref 1.7–7.7)
Neutrophils Relative %: 44 %
Platelets: 235 10*3/uL (ref 150–400)
RBC: 5.84 MIL/uL — ABNORMAL HIGH (ref 3.87–5.11)
RDW: 13.2 % (ref 11.5–15.5)
WBC: 4.7 10*3/uL (ref 4.0–10.5)
nRBC: 0 % (ref 0.0–0.2)

## 2020-03-07 LAB — COMPREHENSIVE METABOLIC PANEL
ALT: 15 U/L (ref 0–44)
AST: 20 U/L (ref 15–41)
Albumin: 4.2 g/dL (ref 3.5–5.0)
Alkaline Phosphatase: 80 U/L (ref 38–126)
Anion gap: 12 (ref 5–15)
BUN: 9 mg/dL (ref 6–20)
CO2: 26 mmol/L (ref 22–32)
Calcium: 9.5 mg/dL (ref 8.9–10.3)
Chloride: 101 mmol/L (ref 98–111)
Creatinine, Ser: 0.93 mg/dL (ref 0.44–1.00)
GFR calc Af Amer: >60 mL/min (ref >60)
GFR calc non Af Amer: >60 mL/min (ref >60)
Glucose, Bld: 81 mg/dL (ref 70–99)
Potassium: 3.2 mmol/L — ABNORMAL LOW (ref 3.5–5.1)
Sodium: 139 mmol/L (ref 135–145)
Total Bilirubin: 1.2 mg/dL (ref 0.3–1.2)
Total Protein: 8.2 g/dL — ABNORMAL HIGH (ref 6.5–8.1)

## 2020-03-07 LAB — TSH: TSH: 0.996 u[IU]/mL (ref 0.350–4.500)

## 2020-03-07 NOTE — ED Provider Notes (Signed)
[facility]    CSN: [id number] Arrival date & time: 03/07/20  1347      History   Chief Complaint Chief Complaint  Patient presents with  . Chest Pain  . Back Pain    HPI Kiara Klein is a 34 y.o. female.   Kiara Klein presents with complaints of symptoms which she has had for about a month. However for the past few days has had chest pain and back pain more frequently. No current chest pain. It comes and goes. Chest pain started first, back pain started yesterday. No current back pain. It is a sharp stabbing sensation which comes and goes. No shortness of breath. No specific triggers. Lasts for only a minute at a time. Also some pain to left axilla and flank. 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 Symptoms aren't worse at work. Rest doesn't improve symptoms. 8/18 went to the ER due to lightheadedness. She felt weak and felt restless legs. Still feels lightheaded, no falls. No blood or black to stool. LMP 9/14, not currently menstruating. Her periods have been irregular and not very heavy. No headache. No wheezing or chest tightness. No cough, no runny nose, no sore throat. No fevers. Sometimes brief nausea, none currently. Doesn't have an active PCP, scheduled in November. No leg pain or swelling, but has a weakness to her legs. Doesn't smoke. Not on any birth control currently. No significant travel recently. She endorses worry and anxiety. Endorses poor sleep habits due to working. No treatments or medications for her symptoms. She does take her BP medication regularly.    ROS per HPI, negative if not otherwise mentioned.      Past Medical History:  Diagnosis Date  . HTN (hypertension)     Patient Active Problem List   Diagnosis Date Noted  . Tibialis tendinitis of right lower extremity 10/11/2016    History reviewed. No pertinent surgical history.  OB History   No obstetric history on file.      Home  Medications    Prior to Admission medications   Medication Sig Start Date End Date Taking? Authorizing Provider  aspirin 81 MG chewable tablet Chew 325 mg by mouth once.   Yes [provider]  hydrochlorothiazide (HYDRODIURIL) 25 MG tablet Take 1 tablet (25 mg total) by mouth daily. 02/06/20  Yes Raeford Razor, MD  ibuprofen (ADVIL) 200 MG tablet Take 800 mg by mouth every 6 (six) hours as needed for moderate pain.   Yes [provider]  naproxen (NAPROSYN) 500 MG tablet Take 500 mg by mouth 2 (two) times daily. 10/27/19  Yes [provider]  methocarbamol (ROBAXIN) 500 MG tablet Take 500 mg by mouth. 10/27/19   [provider]  naproxen (NAPROSYN) 375 MG tablet Take 1 tablet (375 mg total) by mouth 2 (two) times daily. Patient not taking: Reported on 02/25/2019 12/10/16   Elpidio Anis, PA-C    Family History History reviewed. No pertinent family history.  Social History Social History   Tobacco Use  . Smoking status: Never Smoker  . Smokeless tobacco: Never Used  Vaping Use  . Vaping Use: Never used  Substance Use Topics  . Alcohol use: Yes    Alcohol/week: 0.0 standard drinks    Comment: occ  . Drug use: No     Allergies   Bactrim [sulfamethoxazole-trimethoprim] and Penicillins   Review of Systems Review of Systems   Physical Exam Triage Vital Signs ED Triage  Vitals  Enc Vitals Group     BP 03/07/20 1644 (!) 140/99     Pulse Rate 03/07/20 1644 83     Resp 03/07/20 1644 16     Temp 03/07/20 1644 98.4 F (36.9 C)     Temp Source 03/07/20 1644 Oral     SpO2 03/07/20 1644 100 %     Weight --      Height --      Head Circumference --      Peak Flow --      Pain Score 03/07/20 1645 3     Pain Loc --      Pain Edu? --      Excl. in GC? --    No data found.  Updated Vital Signs BP (!) 140/99 (BP Location: Right Arm)   Pulse 83   Temp 98.4 F (36.9 C) (Oral)   Resp 16   LMP 03/04/2020   SpO2 100%   Visual Acuity Right Eye  Distance:   Left Eye Distance:   Bilateral Distance:    Right Eye Near:   Left Eye Near:    Bilateral Near:     Physical Exam Constitutional:      General: She is not in acute distress.    Appearance: She is well-developed.  Cardiovascular:     Rate and Rhythm: Normal rate.  Pulmonary:     Effort: Pulmonary effort is normal.     Breath sounds: Normal breath sounds.  Musculoskeletal:       Arms:     Comments: Left back with point tenderness on palpation   Skin:    General: Skin is warm and dry.  Neurological:     Mental Status: She is alert and oriented to person, place, and time.    EKG:  NSR rate of 76. Previous EKG was available for review. No stwave changes as interpreted by me.    UC Treatments / Results  Labs (all labs ordered are listed, but only abnormal results are displayed) Labs Reviewed  CBC WITH DIFFERENTIAL/PLATELET - Abnormal; Notable for the following components:      Result Value   RBC 5.84 (*)    Hemoglobin 16.5 (*)    HCT 50.5 (*)    All other components within normal limits  COMPREHENSIVE METABOLIC PANEL - Abnormal; Notable for the following components:   Potassium 3.2 (*)    Total Protein 8.2 (*)    All other components within normal limits  TSH    EKG   Radiology No results found.  Procedures Procedures (including critical care time)  Medications Ordered in UC Medications - No data to display  Initial Impression / Assessment and Plan / UC Course  I have reviewed the triage vital signs and the nursing notes.  Pertinent labs & imaging results that were available during my care of the patient were reviewed by me and considered in my medical decision making (see chart for details).     Unfortunately due to wait times for xray patient departed before able to conclude visit with her. CBC and CMP without indication of source of symptoms. CXR was not collected. Endorses occasional worry, anxiety as source of symptoms discussed and  considered as well. Encouraged close follow up with PCP if persistent. No discharge plan discussed.   Final Clinical Impressions(s) / UC Diagnoses   Final diagnoses:  Chest pain, unspecified type  Acute left-sided thoracic back pain   Discharge Instructions   None    ED  Prescriptions    None     PDMP not reviewed this encounter.   Georgetta Haber, NP 03/07/20 [phone]

## 2020-03-07 NOTE — ED Notes (Signed)
Patient LWBS

## 2020-03-07 NOTE — ED Triage Notes (Signed)
Left sided chest pain x 1 week that radiates to left side of back. Patient describes the pain as "stabbing" pain that goes under shoulder blade.  Dizziness and lightheadedness for a month. Pt denies dizziness currently. Patient states she resumed BP meds after visit on 8/18 when she was previously taken off of BP medications. Patient has complaints of feeling weak. Back pain started on yesterday. Patient she has taken aspirin and naproxen today with no relief. Patient denies chest pain currently states pain comes and goes.

## 2020-03-13 ENCOUNTER — Other Ambulatory Visit: Payer: Self-pay

## 2020-03-13 ENCOUNTER — Encounter (HOSPITAL_BASED_OUTPATIENT_CLINIC_OR_DEPARTMENT_OTHER): Payer: Self-pay | Admitting: *Deleted

## 2020-03-13 ENCOUNTER — Emergency Department (HOSPITAL_BASED_OUTPATIENT_CLINIC_OR_DEPARTMENT_OTHER)
Admission: EM | Admit: 2020-03-13 | Discharge: 2020-03-13 | Disposition: A | Discharge status: Home / Self Care | Payer: BC Managed Care – PPO | Attending: Emergency Medicine | Admitting: Emergency Medicine

## 2020-03-13 DIAGNOSIS — Z7982 Long term (current) use of aspirin: Secondary | ICD-10-CM | POA: Diagnosis not present

## 2020-03-13 DIAGNOSIS — R42 Dizziness and giddiness: Secondary | ICD-10-CM | POA: Diagnosis not present

## 2020-03-13 DIAGNOSIS — Z79899 Other long term (current) drug therapy: Secondary | ICD-10-CM | POA: Diagnosis not present

## 2020-03-13 DIAGNOSIS — I10 Essential (primary) hypertension: Secondary | ICD-10-CM | POA: Insufficient documentation

## 2020-03-13 LAB — CBC WITH DIFFERENTIAL/PLATELET
Abs Immature Granulocytes: 0.02 10*3/uL (ref 0.00–0.07)
Basophils Absolute: 0.1 10*3/uL (ref 0.0–0.1)
Basophils Relative: 1 %
Eosinophils Absolute: 0.1 10*3/uL (ref 0.0–0.5)
Eosinophils Relative: 1 %
HCT: 46.6 % — ABNORMAL HIGH (ref 36.0–46.0)
Hemoglobin: 15.5 g/dL — ABNORMAL HIGH (ref 12.0–15.0)
Immature Granulocytes: 0 %
Lymphocytes Relative: 46 %
Lymphs Abs: 2.3 10*3/uL (ref 0.7–4.0)
MCH: 28.5 pg (ref 26.0–34.0)
MCHC: 33.3 g/dL (ref 30.0–36.0)
MCV: 85.7 fL (ref 80.0–100.0)
Monocytes Absolute: 0.4 10*3/uL (ref 0.1–1.0)
Monocytes Relative: 8 %
Neutro Abs: 2.2 10*3/uL (ref 1.7–7.7)
Neutrophils Relative %: 44 %
Platelets: 262 10*3/uL (ref 150–400)
RBC: 5.44 MIL/uL — ABNORMAL HIGH (ref 3.87–5.11)
RDW: 13.1 % (ref 11.5–15.5)
WBC: 5 10*3/uL (ref 4.0–10.5)
nRBC: 0 % (ref 0.0–0.2)

## 2020-03-13 LAB — COMPREHENSIVE METABOLIC PANEL
ALT: 17 U/L (ref 0–44)
AST: 17 U/L (ref 15–41)
Albumin: 4.2 g/dL (ref 3.5–5.0)
Alkaline Phosphatase: 71 U/L (ref 38–126)
Anion gap: 12 (ref 5–15)
BUN: 12 mg/dL (ref 6–20)
CO2: 24 mmol/L (ref 22–32)
Calcium: 9.2 mg/dL (ref 8.9–10.3)
Chloride: 98 mmol/L (ref 98–111)
Creatinine, Ser: 0.94 mg/dL (ref 0.44–1.00)
GFR calc Af Amer: >60 mL/min (ref >60)
GFR calc non Af Amer: >60 mL/min (ref >60)
Glucose, Bld: 87 mg/dL (ref 70–99)
Potassium: 3.7 mmol/L (ref 3.5–5.1)
Sodium: 134 mmol/L — ABNORMAL LOW (ref 135–145)
Total Bilirubin: 0.7 mg/dL (ref 0.3–1.2)
Total Protein: 8.2 g/dL — ABNORMAL HIGH (ref 6.5–8.1)

## 2020-03-13 NOTE — ED Notes (Signed)
Denies being sick lately, denies any fevers, N/V/D. Appears in no distress at this time, MAE x 4, moves self in bed without assistance. ED PA at bedside

## 2020-03-13 NOTE — ED Notes (Signed)
AVS reviewed with client, discuss changing positions slowly when awaking in am due being on antihypertensives, also recommended to pt to ck BP at home BID and keep a diary to show her primary MD of the trends and to mention her experiences of being lightheaded in am. Opportunity for questions provided, copy of AVS provided

## 2020-03-13 NOTE — Discharge Instructions (Signed)
See your Physician for recheck.  °

## 2020-03-13 NOTE — ED Notes (Signed)
Presents with c/o light headiness and weakness in her legs, onset approx 1 month ago. Usually occurs in am when awaking. Have had issues with vertigo before. "my legs feel like Jello at times, can come and go throughout the day" denies shortness of breath, headaches, does have some episodes with blurred vision when feeling weak and light headed

## 2020-03-13 NOTE — ED Triage Notes (Signed)
C/o dizziness every am for 1 month

## 2020-03-14 NOTE — ED Provider Notes (Signed)
[facility] EMERGENCY DEPARTMENT Provider Note   CSN: [id number] Arrival date & time: 03/13/20  1343     History Chief Complaint  Patient presents with  . Dizziness    Kiara Klein is a 34 y.o. female.  The history is provided by the patient. No language interpreter was used.  Dizziness Quality:  Lightheadedness Severity:  Moderate Onset quality:  Sudden Duration:  1 month Timing:  Intermittent Progression:  Unchanged Chronicity:  Recurrent Context: standing up   Relieved by:  Nothing Worsened by:  Nothing Ineffective treatments:  None tried Associated symptoms: no chest pain, no nausea, no vision changes and no vomiting   Risk factors: no anemia   Pt reports frequent dizziness with standing.  Pt reports she has when she gets out of bed in the am.      Past Medical History:  Diagnosis Date  . HTN (hypertension)     Patient Active Problem List   Diagnosis Date Noted  . Tibialis tendinitis of right lower extremity 10/11/2016    History reviewed. No pertinent surgical history.   OB History   No obstetric history on file.     History reviewed. No pertinent family history.  Social History   Tobacco Use  . Smoking status: Never Smoker  . Smokeless tobacco: Never Used  Vaping Use  . Vaping Use: Never used  Substance Use Topics  . Alcohol use: Yes    Alcohol/week: 0.0 standard drinks    Comment: occ  . Drug use: No    Home Medications Prior to Admission medications   Medication Sig Start Date End Date Taking? Authorizing Provider  aspirin 81 MG chewable tablet Chew 325 mg by mouth once.    [provider]  hydrochlorothiazide (HYDRODIURIL) 25 MG tablet Take 1 tablet (25 mg total) by mouth daily. 02/06/20   Raeford Razor, MD  ibuprofen (ADVIL) 200 MG tablet Take 800 mg by mouth every 6 (six) hours as needed for moderate pain.    [provider]  methocarbamol (ROBAXIN) 500 MG tablet Take 500 mg by mouth. 10/27/19    [provider]  naproxen (NAPROSYN) 375 MG tablet Take 1 tablet (375 mg total) by mouth 2 (two) times daily. Patient not taking: Reported on 02/25/2019 12/10/16   Elpidio Anis, PA-C  naproxen (NAPROSYN) 500 MG tablet Take 500 mg by mouth 2 (two) times daily. 10/27/19   [provider]    Allergies    Bactrim [sulfamethoxazole-trimethoprim] and Penicillins  Review of Systems   Review of Systems  Cardiovascular: Negative for chest pain.  Gastrointestinal: Negative for nausea and vomiting.  Neurological: Positive for dizziness.  All other systems reviewed and are negative.   Physical Exam Updated Vital Signs BP 126/88   Pulse 84   Temp 98.6 F (37 C) (Oral)   Resp 18   Ht 5\' 6"  (1.676 m)   Wt 85.7 kg   LMP 03/04/2020   SpO2 99%   BMI 30.51 kg/m   Physical Exam Vitals and nursing note reviewed.  Constitutional:      Appearance: She is well-developed.  HENT:     Head: Normocephalic.     Nose: Nose normal.  Eyes:     Pupils: Pupils are equal, round, and reactive to light.  Cardiovascular:     Rate and Rhythm: Normal rate and regular rhythm.  Pulmonary:     Effort: Pulmonary effort is normal.  Abdominal:     General: There is no distension.  Musculoskeletal:  General: Normal range of motion.     Cervical back: Normal range of motion.  Skin:    General: Skin is warm.  Neurological:     Mental Status: She is alert and oriented to person, place, and time.  Psychiatric:        Mood and Affect: Mood normal.     ED Results / Procedures / Treatments   Labs (all labs ordered are listed, but only abnormal results are displayed) Labs Reviewed  CBC WITH DIFFERENTIAL/PLATELET - Abnormal; Notable for the following components:      Result Value   RBC 5.44 (*)    Hemoglobin 15.5 (*)    HCT 46.6 (*)    All other components within normal limits  COMPREHENSIVE METABOLIC PANEL - Abnormal; Notable for the following components:   Sodium 134 (*)    Total  Protein 8.2 (*)    All other components within normal limits    EKG None  Radiology No results found.  Procedures Procedures (including critical care time)  Medications Ordered in ED Medications - No data to display  ED Course  I have reviewed the triage vital signs and the nursing notes.  Pertinent labs & imaging results that were available during my care of the patient were reviewed by me and considered in my medical decision making (see chart for details).    MDM Rules/Calculators/A&P                          MDM:  Pt's heart rate increases greater than 20 with standing.  Pt is on hctz.  Pt reports not drinking very much water.  Pt advised to increase fluid intake, follow up with primary care for recheck  Final Clinical Impression(s) / ED Diagnoses Final diagnoses:  Orthostatic dizziness    Rx / DC Orders ED Discharge Orders    None    An After Visit Summary was printed and given to the patient.    Osie Cheeks 03/14/20 2053    Gwyneth Sprout, MD 03/21/20 2211

## 2020-03-17 ENCOUNTER — Telehealth: Payer: Self-pay | Admitting: Family Medicine

## 2020-03-17 NOTE — Telephone Encounter (Signed)
Called pt per CRM  LVM for pt to c/b  t called to reschedule her appt for an earlier date. Pt

## 2020-03-25 ENCOUNTER — Ambulatory Visit: Payer: BC Managed Care – PPO | Admitting: Family Medicine

## 2020-03-25 VITALS — BP 124/88 | HR 88 | Ht 67.0 in | Wt 192.0 lb

## 2020-03-25 DIAGNOSIS — F321 Major depressive disorder, single episode, moderate: Secondary | ICD-10-CM

## 2020-03-25 DIAGNOSIS — R110 Nausea: Secondary | ICD-10-CM

## 2020-03-25 DIAGNOSIS — R350 Frequency of micturition: Secondary | ICD-10-CM

## 2020-03-25 DIAGNOSIS — F419 Anxiety disorder, unspecified: Secondary | ICD-10-CM

## 2020-03-25 LAB — POCT URINALYSIS DIP (CLINITEK)
Bilirubin, UA: NEGATIVE
Glucose, UA: NEGATIVE mg/dL
Ketones, POC UA: NEGATIVE mg/dL
Leukocytes, UA: NEGATIVE
Nitrite, UA: NEGATIVE
POC PROTEIN,UA: NEGATIVE
Spec Grav, UA: 1.015 (ref 1.010–1.025)
Urobilinogen, UA: 0.2 E.U./dL — NL
pH, UA: 6 (ref 5.0–8.0)

## 2020-03-25 LAB — POCT URINE PREGNANCY: Preg Test, Ur: NEGATIVE

## 2020-03-25 MED ORDER — SERTRALINE HCL 50 MG PO TABS: 50.0000 mg | ORAL_TABLET | Freq: Every day | ORAL | 1 refills | Status: DC

## 2020-03-25 NOTE — Progress Notes (Signed)
Subjective:     Patient ID: Kiara Klein, female   DOB: 07-07-1985, 34 y.o.   MRN: [id number]  HPI  [facility] with complaints of dizziness/light-headedness for the last 6 weeks, as well as nausea and increased urinary frequency the last couple weeks. She has been seen at urgent care for the same on 02/06/20, 03/07/20, and 03/13/20.  She states most days she wakes up feeling dizzy and can last up to 2-3 hours. She denies syncope. States that she feels unsteady and light-headed. Denies any sensation of the room spinning. She at times has chest pain which she went to ED for evaluation of and states her EKG was normal. She denies any new medications. She was restarted on her HCTZ and has been doing well with this. She does report occasionally missing doses. She does have a BP monitor at home and checks her BP. She does not drink much water, she states maybe 1 bottle a day. Otherwise will drink juice, sweet tea, or ginger ale. Does not drink much caffeine. States she eats sporadically throughout the day.   She reports hx of being on Depo for 18 years, stopped feb. 2020. Had some vaginal bleeding a couple weeks ago, has been off and on, some spotting. Breast tenderness. Sexually active, no birth control. Urinary frequency  She does report a significant amount of anxiety and stress. States her mood has been more down recently. Having moments of tearfulness and increased agitation. She denies SI/HI. She reports long-standing hx of anxiety but has never been treated.   Past Medical History:  Diagnosis Date  . HTN (hypertension)    Allergies  Allergen Reactions  . Bactrim [Sulfamethoxazole-Trimethoprim] Hives  . Penicillins Hives    Has patient had a PCN reaction causing immediate rash, facial/tongue/throat swelling, SOB or lightheadedness with hypotension: No Has patient had a PCN reaction causing severe rash involving mucus membranes or skin  necrosis: Yes Has patient had a PCN reaction that required hospitalization Yes Has patient had a PCN reaction occurring within the last 10 years: No If all of the above answers are "NO", then may proceed with Cephalosporin use.     Current Outpatient Medications:  .  hydrochlorothiazide (HYDRODIURIL) 25 MG tablet, Take 1 tablet (25 mg total) by mouth daily., Disp: 30 tablet, Rfl: 2 .  sertraline (ZOLOFT) 50 MG tablet, Take 1 tablet (50 mg total) by mouth daily. Start by taking 1/2 tablet daily x 1-2 weeks. Then increase to full tablet daily., Disp: 30 tablet, Rfl: 1     Review of Systems  Constitutional: Negative for chills, fever and unexpected weight change.  HENT: Negative for congestion, ear pain, sinus pressure and sinus pain.   Eyes: Negative for photophobia.  Respiratory: Negative for cough and shortness of breath.   Cardiovascular: Negative for chest pain and leg swelling.  Gastrointestinal: Positive for nausea. Negative for abdominal pain, constipation, diarrhea and vomiting.  Genitourinary: Positive for frequency. Negative for difficulty urinating, dysuria, hematuria, pelvic pain, vaginal bleeding and vaginal discharge.  Skin: Negative for color change.  Neurological: Positive for dizziness and light-headedness. Negative for syncope, facial asymmetry, speech difficulty, numbness and headaches.  Psychiatric/Behavioral: Positive for dysphoric mood and sleep disturbance. Negative for behavioral problems, confusion and suicidal ideas. The patient is nervous/anxious.        Objective:   Physical Exam Vitals reviewed.  Constitutional:      General: She is not in acute distress.  Appearance: Normal appearance. She is not toxic-appearing.  HENT:     Head: Normocephalic and atraumatic.     Nose: Nose normal.     Mouth/Throat:     Mouth: Mucous membranes are moist.  Eyes:     General:        Right eye: No discharge.        Left eye: No discharge.     Extraocular  Movements: Extraocular movements intact.     Pupils: Pupils are equal, round, and reactive to light.  Neck:     Thyroid: No thyroid mass, thyromegaly or thyroid tenderness.  Cardiovascular:     Rate and Rhythm: Normal rate and regular rhythm.     Heart sounds: Normal heart sounds.  Pulmonary:     Effort: Pulmonary effort is normal. No respiratory distress.     Breath sounds: Normal breath sounds.  Abdominal:     Palpations: Abdomen is soft.  Musculoskeletal:     Cervical back: Neck supple. No rigidity.  Lymphadenopathy:     Cervical: No cervical adenopathy.  Skin:    General: Skin is warm and dry.  Neurological:     Mental Status: She is alert and oriented to person, place, and time.     Motor: No weakness.     Coordination: Coordination normal.     Gait: Gait normal.  Psychiatric:        Mood and Affect: Mood normal.        Behavior: Behavior normal.    Today's Vitals   03/25/20 1006  BP: 124/88  Pulse: 88  SpO2: 97%  Weight: 192 lb (87.1 kg)  Height: 5\' 7"  (1.702 m)   Body mass index is 30.07 kg/m. Lying- 118/80. 98 Sitting- 118/86, 97 Standing- 116/88. 110  Results for orders placed or performed in visit on 03/25/20  POCT urine pregnancy  Result Value Ref Range   Preg Test, Ur Negative Negative  POCT URINALYSIS DIP (CLINITEK)  Result Value Ref Range   Color, UA yellow yellow   Clarity, UA clear clear   Glucose, UA negative negative mg/dL   Bilirubin, UA negative negative   Ketones, POC UA negative negative mg/dL   Spec Grav, UA 05/25/20 4.765 - 1.025   Blood, UA trace-intact (A) negative   pH, UA 6.0 5.0 - 8.0   POC PROTEIN,UA negative negative, trace   Urobilinogen, UA 0.2 0.2 or 1.0 E.U./dL   Nitrite, UA Negative Negative   Leukocytes, UA Negative Negative   GAD 7 : Generalized Anxiety Score 03/25/2020  Nervous, Anxious, on Edge 3  Control/stop worrying 3  Worry too much - different things 3  Trouble relaxing 3  Restless 3  Easily annoyed or  irritable 3  Afraid - awful might happen 3  Total GAD 7 Score 21  Anxiety Difficulty Somewhat difficult    Depression screen [facility] 2/9 03/25/2020 09/09/2016 08/30/2015  Decreased Interest 0 0 0  Down, Depressed, Hopeless 2 0 0  PHQ - 2 Score 2 0 0  Altered sleeping 3 - -  Tired, decreased energy 3 - -  Change in appetite 2 - -  Feeling bad or failure about yourself  0 - -  Trouble concentrating 2 - -  Moving slowly or fidgety/restless 3 - -  Suicidal thoughts 0 - -  PHQ-9 Score 15 - -  Difficult doing work/chores Somewhat difficult - -        Assessment:     Depression, major, single episode, moderate (HCC)  Urinary  frequency - Plan: POCT urine pregnancy, POCT URINALYSIS DIP (CLINITEK)  Nausea - Plan: POCT urine pregnancy  Anxiety      Plan:     1. Significant time spent reviewing pt's previous encounters at urgent care and lab results there. Pregnancy test negative and urine dipstick unremarkable here. Discussed with patient that I recommend increasing her water intake and eating on a regular basis to help improve her light-headedness. Should this not improve I recommend further follow up with her PCP, who she has an appt with the end of this month.  2. Discussed importance of good sleep hygiene to help with her difficulty sleeping.  3. Reviewed PHQ9 and GAD7 results with pt. Significant evidence of anxiety and depression. Recommend treatment with medication and CBT. Pt can seek counseling through [employer] at work or get referral from her PCP. Risks and benefits of medication discussed. She will start on sertraline, taking 1/2 tablet daily for 1-2 weeks and then increasing to full tablet daily. She is not suicidal. She understands that if her depression worsens or she develops thoughts of suicide she is to seek emergency care. She should f/u with her PCP to maintain this medication. Pt verbalized understanding and is in agreement with this plan.

## 2020-03-25 NOTE — Patient Instructions (Addendum)
- Drink more water. At least 8 glasses a day.  - Turn off all screens at least an hour before bedtime and wind down by reading a book or listening to music.  - Try lavender essential oil to help with sleep and decrease anxiety. - Try to increase physical activity. Even going on a 5-10 minute walk can help.  - Follow up with your primary care provider.   Sertraline tablets What is this medicine? SERTRALINE (SER tra leen) is used to treat depression. It may also be used to treat obsessive compulsive disorder, panic disorder, post-trauma stress, premenstrual dysphoric disorder (PMDD) or social anxiety. This medicine may be used for other purposes; ask your health care provider or pharmacist if you have questions. COMMON BRAND NAME(S): Zoloft What should I tell my health care provider before I take this medicine? They need to know if you have any of these conditions:  bleeding disorders  bipolar disorder or a family history of bipolar disorder  glaucoma  heart disease  high blood pressure  history of irregular heartbeat  history of low levels of calcium, magnesium, or potassium in the blood  if you often drink alcohol  liver disease  receiving electroconvulsive therapy  seizures  suicidal thoughts, plans, or attempt; a previous suicide attempt by you or a family member  take medicines that treat or prevent blood clots  thyroid disease  an unusual or allergic reaction to sertraline, other medicines, foods, dyes, or preservatives  pregnant or trying to get pregnant  breast-feeding How should I use this medicine? Take this medicine by mouth with a glass of water. Follow the directions on the prescription label. You can take it with or without food. Take your medicine at regular intervals. Do not take your medicine more often than directed. Do not stop taking this medicine suddenly except upon the advice of your doctor. Stopping this medicine too quickly may cause serious side  effects or your condition may worsen. A special MedGuide will be given to you by the pharmacist with each prescription and refill. Be sure to read this information carefully each time. Talk to your pediatrician regarding the use of this medicine in children. While this drug may be prescribed for children as young as 7 years for selected conditions, precautions do apply. Overdosage: If you think you have taken too much of this medicine contact a poison control center or emergency room at once. NOTE: This medicine is only for you. Do not share this medicine with others. What if I miss a dose? If you miss a dose, take it as soon as you can. If it is almost time for your next dose, take only that dose. Do not take double or extra doses. What may interact with this medicine? Do not take this medicine with any of the following medications:  cisapride  dronedarone  linezolid  MAOIs like Carbex, Eldepryl, Marplan, Nardil, and Parnate  methylene blue (injected into a vein)  pimozide  thioridazine This medicine may also interact with the following medications:  alcohol  amphetamines  aspirin and aspirin-like medicines  certain medicines for depression, anxiety, or psychotic disturbances  certain medicines for fungal infections like ketoconazole, fluconazole, posaconazole, and itraconazole  certain medicines for irregular heart beat like flecainide, quinidine, propafenone  certain medicines for migraine headaches like almotriptan, eletriptan, frovatriptan, naratriptan, rizatriptan, sumatriptan, zolmitriptan  certain medicines for sleep  certain medicines for seizures like carbamazepine, valproic acid, phenytoin  certain medicines that treat or prevent blood clots like warfarin,  enoxaparin, dalteparin  cimetidine  digoxin  diuretics  fentanyl  isoniazid  lithium  NSAIDs, medicines for pain and inflammation, like ibuprofen or naproxen  other medicines that prolong the QT  interval (cause an abnormal heart rhythm) like dofetilide  rasagiline  safinamide  supplements like St. John's wort, kava kava, valerian  tolbutamide  tramadol  tryptophan This list may not describe all possible interactions. Give your health care provider a list of all the medicines, herbs, non-prescription drugs, or dietary supplements you use. Also tell them if you smoke, drink alcohol, or use illegal drugs. Some items may interact with your medicine. What should I watch for while using this medicine? Tell your doctor if your symptoms do not get better or if they get worse. Visit your doctor or health care professional for regular checks on your progress. Because it may take several weeks to see the full effects of this medicine, it is important to continue your treatment as prescribed by your doctor. Patients and their families should watch out for new or worsening thoughts of suicide or depression. Also watch out for sudden changes in feelings such as feeling anxious, agitated, panicky, irritable, hostile, aggressive, impulsive, severely restless, overly excited and hyperactive, or not being able to sleep. If this happens, especially at the beginning of treatment or after a change in dose, call your health care professional. Kiara Klein may get drowsy or dizzy. Do not drive, use machinery, or do anything that needs mental alertness until you know how this medicine affects you. Do not stand or sit up quickly, especially if you are an older patient. This reduces the risk of dizzy or fainting spells. Alcohol may interfere with the effect of this medicine. Avoid alcoholic drinks. Your mouth may get dry. Chewing sugarless gum or sucking hard candy, and drinking plenty of water may help. Contact your doctor if the problem does not go away or is severe. What side effects may I notice from receiving this medicine? Side effects that you should report to your doctor or health care professional as soon as  possible:  allergic reactions like skin rash, itching or hives, swelling of the face, lips, or tongue  anxious  black, tarry stools  changes in vision  confusion  elevated mood, decreased need for sleep, racing thoughts, impulsive behavior  eye pain  fast, irregular heartbeat  feeling faint or lightheaded, falls  feeling agitated, angry, or irritable  hallucination, loss of contact with reality  loss of balance or coordination  loss of memory  painful or prolonged erections  restlessness, pacing, inability to keep still  seizures  stiff muscles  suicidal thoughts or other mood changes  trouble sleeping  unusual bleeding or bruising  unusually weak or tired  vomiting Side effects that usually do not require medical attention (report to your doctor or health care professional if they continue or are bothersome):  change in appetite or weight  change in sex drive or performance  diarrhea  increased sweating  indigestion, nausea  tremors This list may not describe all possible side effects. Call your doctor for medical advice about side effects. You may report side effects to FDA at 1-800-FDA-1088. Where should I keep my medicine? Keep out of the reach of children. Store at room temperature between 15 and 30 degrees C (59 and 86 degrees F). Throw away any unused medicine after the expiration date. NOTE: This sheet is a summary. It may not cover all possible information. If you have questions about this medicine,  talk to your doctor, pharmacist, or health care provider.  2020 [employer]vier/Gold Standard (2018-05-30 10:09:27)

## 2020-04-03 ENCOUNTER — Emergency Department (HOSPITAL_COMMUNITY): Payer: BC Managed Care – PPO

## 2020-04-03 ENCOUNTER — Other Ambulatory Visit: Payer: Self-pay

## 2020-04-03 ENCOUNTER — Encounter (HOSPITAL_COMMUNITY): Payer: Self-pay | Admitting: *Deleted

## 2020-04-03 ENCOUNTER — Emergency Department (HOSPITAL_COMMUNITY)
Admission: EM | Admit: 2020-04-03 | Discharge: 2020-04-03 | Disposition: A | Discharge status: Home / Self Care | Payer: BC Managed Care – PPO | Attending: Emergency Medicine | Admitting: Emergency Medicine

## 2020-04-03 DIAGNOSIS — R531 Weakness: Secondary | ICD-10-CM | POA: Diagnosis present

## 2020-04-03 DIAGNOSIS — R42 Dizziness and giddiness: Secondary | ICD-10-CM | POA: Diagnosis not present

## 2020-04-03 DIAGNOSIS — I10 Essential (primary) hypertension: Secondary | ICD-10-CM | POA: Insufficient documentation

## 2020-04-03 DIAGNOSIS — R079 Chest pain, unspecified: Secondary | ICD-10-CM | POA: Insufficient documentation

## 2020-04-03 DIAGNOSIS — E876 Hypokalemia: Secondary | ICD-10-CM | POA: Insufficient documentation

## 2020-04-03 DIAGNOSIS — Z79899 Other long term (current) drug therapy: Secondary | ICD-10-CM | POA: Diagnosis not present

## 2020-04-03 DIAGNOSIS — E860 Dehydration: Secondary | ICD-10-CM | POA: Diagnosis not present

## 2020-04-03 LAB — URINALYSIS, ROUTINE W REFLEX MICROSCOPIC
Bacteria, UA: NONE SEEN
Bilirubin Urine: NEGATIVE
Glucose, UA: NEGATIVE mg/dL
Hgb urine dipstick: NEGATIVE
Ketones, ur: 20 mg/dL — AB
Nitrite: NEGATIVE
Protein, ur: NEGATIVE mg/dL
Specific Gravity, Urine: 1.017 (ref 1.005–1.030)
pH: 6 (ref 5.0–8.0)

## 2020-04-03 LAB — BASIC METABOLIC PANEL
Anion gap: 11 (ref 5–15)
BUN: 9 mg/dL (ref 6–20)
CO2: 26 mmol/L (ref 22–32)
Calcium: 9.1 mg/dL (ref 8.9–10.3)
Chloride: 102 mmol/L (ref 98–111)
Creatinine, Ser: 0.9 mg/dL (ref 0.44–1.00)
GFR, Estimated: >60 mL/min (ref >60)
Glucose, Bld: 86 mg/dL (ref 70–99)
Potassium: 3.2 mmol/L — ABNORMAL LOW (ref 3.5–5.1)
Sodium: 139 mmol/L (ref 135–145)

## 2020-04-03 LAB — CBC
HCT: 44.3 % (ref 36.0–46.0)
Hemoglobin: 15.1 g/dL — ABNORMAL HIGH (ref 12.0–15.0)
MCH: 29 pg (ref 26.0–34.0)
MCHC: 34.1 g/dL (ref 30.0–36.0)
MCV: 85 fL (ref 80.0–100.0)
Platelets: 228 10*3/uL (ref 150–400)
RBC: 5.21 MIL/uL — ABNORMAL HIGH (ref 3.87–5.11)
RDW: 13.1 % (ref 11.5–15.5)
WBC: 4.7 10*3/uL (ref 4.0–10.5)
nRBC: 0 % (ref 0.0–0.2)

## 2020-04-03 LAB — TSH: TSH: 0.647 u[IU]/mL (ref 0.350–4.500)

## 2020-04-03 LAB — TROPONIN I (HIGH SENSITIVITY)
Troponin I (High Sensitivity): 2 ng/L (ref <18)
Troponin I (High Sensitivity): <2 ng/L (ref <18)

## 2020-04-03 LAB — PREGNANCY, URINE: Preg Test, Ur: NEGATIVE

## 2020-04-03 MED ORDER — POTASSIUM CHLORIDE CRYS ER 20 MEQ PO TBCR
20.0000 meq | EXTENDED_RELEASE_TABLET | Freq: Once | ORAL | Status: AC
  Administered 2020-04-03: 20 meq via ORAL
  Filled 2020-04-03: qty 1

## 2020-04-03 NOTE — ED Provider Notes (Signed)
[facility]-EMERGENCY DEPT Provider Note   CSN: [id number] Arrival date & time: 04/03/20  1355     History Chief Complaint  Patient presents with  . Dizziness  . Weakness  . Chest Pain    Kiara Klein is a 34 y.o. female. She has a history of hypertension and depression. Complaining of a few months of on and off symptoms including generalized weakness, restless legs, dizziness lightheadedness like she might pass out, nausea, back pain, chest pain. She said a few urgent care and emergency visits for such. She has an appointment with her primary care doctor in a few weeks did not feel she could wait. She lost a little weight which she attributes to stopping her Depo shot. Restarted on hydrochlorothiazide.  The history is provided by the patient.  Dizziness Quality:  Lightheadedness Severity:  Moderate Onset quality:  Gradual Timing:  Intermittent Progression:  Unchanged Chronicity:  New Relieved by:  Nothing Worsened by:  Nothing Ineffective treatments:  None tried Associated symptoms: chest pain, nausea and weakness (general)   Associated symptoms: no diarrhea, no headaches, no hearing loss, no shortness of breath, no syncope, no tinnitus, no vision changes and no vomiting   Weakness Severity:  Moderate Onset quality:  Gradual Timing:  Intermittent Progression:  Unchanged Chronicity:  New Relieved by:  Nothing Worsened by:  Nothing Ineffective treatments:  None tried Associated symptoms: chest pain, dizziness, nausea and near-syncope   Associated symptoms: no abdominal pain, no diarrhea, no fever, no frequency, no headaches, no shortness of breath, no syncope, no vision change and no vomiting   Risk factors: no coronary artery disease and no diabetes   Chest Pain Associated symptoms: back pain, dizziness, fatigue, nausea, near-syncope and weakness (general)   Associated symptoms: no abdominal pain, no fever, no headache, no shortness of  breath, no syncope and no vomiting        Past Medical History:  Diagnosis Date  . HTN (hypertension)     Patient Active Problem List   Diagnosis Date Noted  . Depression, major, single episode, moderate (HCC) 03/25/2020  . Anxiety 03/25/2020  . Tibialis tendinitis of right lower extremity 10/11/2016    History reviewed. No pertinent surgical history.   OB History   No obstetric history on file.     No family history on file.  Social History   Tobacco Use  . Smoking status: Never Smoker  . Smokeless tobacco: Never Used  Vaping Use  . Vaping Use: Never used  Substance Use Topics  . Alcohol use: Yes    Alcohol/week: 0.0 standard drinks    Comment: occ  . Drug use: No    Home Medications Prior to Admission medications   Medication Sig Start Date End Date Taking? Authorizing Provider  hydrochlorothiazide (HYDRODIURIL) 25 MG tablet Take 1 tablet (25 mg total) by mouth daily. 02/06/20   Raeford Razor, MD  sertraline (ZOLOFT) 50 MG tablet Take 1 tablet (50 mg total) by mouth daily. Start by taking 1/2 tablet daily x 1-2 weeks. Then increase to full tablet daily. 03/25/20   Jeannine Boga, NP    Allergies    Bactrim [sulfamethoxazole-trimethoprim] and Penicillins  Review of Systems   Review of Systems  Constitutional: Positive for fatigue. Negative for fever.  HENT: Negative for hearing loss and tinnitus.   Eyes: Negative for visual disturbance.  Respiratory: Negative for shortness of breath.   Cardiovascular: Positive for chest pain and near-syncope. Negative for syncope.  Gastrointestinal: Positive for nausea. Negative  for abdominal pain, diarrhea and vomiting.  Genitourinary: Negative for frequency.  Musculoskeletal: Positive for back pain.  Skin: Negative for rash.  Neurological: Positive for dizziness, weakness (general) and light-headedness. Negative for headaches.    Physical Exam Updated Vital Signs BP (!) 129/101 (BP Location: Left Arm)   Pulse 92    Temp 98.7 F (37.1 C) (Oral)   Resp 16   Ht 5\' 7"  (1.702 m)   Wt 87.1 kg   LMP 03/04/2020   SpO2 99%   BMI 30.07 kg/m   Physical Exam Vitals and nursing note reviewed.  Constitutional:      General: She is not in acute distress.    Appearance: She is well-developed.  HENT:     Head: Normocephalic and atraumatic.  Eyes:     Conjunctiva/sclera: Conjunctivae normal.  Cardiovascular:     Rate and Rhythm: Normal rate and regular rhythm.     Heart sounds: Normal heart sounds. No murmur heard.   Pulmonary:     Effort: Pulmonary effort is normal. No respiratory distress.     Breath sounds: Normal breath sounds.  Abdominal:     Palpations: Abdomen is soft.     Tenderness: There is no abdominal tenderness.  Musculoskeletal:        General: Normal range of motion.     Cervical back: Neck supple.     Right lower leg: No tenderness. No edema.     Left lower leg: No tenderness. No edema.  Skin:    General: Skin is warm and dry.     Capillary Refill: Capillary refill takes less than 2 seconds.  Neurological:     General: No focal deficit present.     Mental Status: She is alert.     GCS: GCS eye subscore is 4. GCS verbal subscore is 5. GCS motor subscore is 6.     Cranial Nerves: Cranial nerves are intact.     Sensory: Sensation is intact.     Gait: Gait is intact.     ED Results / Procedures / Treatments   Labs (all labs ordered are listed, but only abnormal results are displayed) Labs Reviewed  BASIC METABOLIC PANEL - Abnormal; Notable for the following components:      Result Value   Potassium 3.2 (*)    All other components within normal limits  CBC - Abnormal; Notable for the following components:   RBC 5.21 (*)    Hemoglobin 15.1 (*)    All other components within normal limits  URINALYSIS, ROUTINE W REFLEX MICROSCOPIC - Abnormal; Notable for the following components:   Ketones, ur 20 (*)    Leukocytes,Ua TRACE (*)    All other components within normal limits    TSH  PREGNANCY, URINE  TROPONIN I (HIGH SENSITIVITY)  TROPONIN I (HIGH SENSITIVITY)    EKG EKG Interpretation  Date/Time:  Thursday April 03 2020 14:28:49 EDT Ventricular Rate:  85 PR Interval:    QRS Duration: 93 QT Interval:  383 QTC Calculation: 456 R Axis:   52 Text Interpretation: Sinus rhythm 12 Lead; Mason-Likar No significant change since prior 8/21 Confirmed by 9/21 [id number]) on 04/03/2020 2:58:32 PM   Radiology DG Chest 2 View  Result Date: 04/03/2020 CLINICAL DATA:  34 year old female with intermittent chest pain, dizziness, weakness. EXAM: CHEST - 2 VIEW COMPARISON:  Chest radiographs 07/05/2019 and earlier. FINDINGS: Lung volumes and mediastinal contours remain normal. The lungs are clear. Visualized tracheal air column is within normal limits. No pneumothorax or  pleural effusion. No osseous abnormality identified.  Paucity of bowel gas. IMPRESSION: Negative.  No cardiopulmonary abnormality. Electronically Signed   By: Odessa Fleming M.D.   On: 04/03/2020 15:05    Procedures Procedures (including critical care time)  Medications Ordered in ED Medications  potassium chloride SA (KLOR-CON) CR tablet 20 mEq (20 mEq Oral Given 04/03/20 1640)    ED Course  I have reviewed the triage vital signs and the nursing notes.  Pertinent labs & imaging results that were available during my care of the patient were reviewed by me and considered in my medical decision making (see chart for details).  Clinical Course as of Apr 04 1225  Thu Apr 03, 2020  1504 Chest x-ray interpreted by me as no acute disease.   [MB]  1733 I reviewed results with patient and she is comfortable with plan for continue to hydrate at home.  Follow-up with PCP.  Somehow I inadvertently did not order a pregnancy test.  I checked in with her and she said she has had one recently and it was negative and she has no concerns about being pregnant.   [MB]    Clinical Course User Index [MB] Terrilee Files, MD   MDM Rules/Calculators/A&P                         This patient complains of dizzy weak lightheaded intermittent chest pain; this involves an extensive number of treatment Options and is a complaint that carries with it a high risk of complications and Morbidity. The differential includes anemia, metabolic derangement, dehydration, renal failure, thyroid disorder, ACS, pneumonia, pneumothorax  I ordered, reviewed and interpreted labs, which included CBC with normal white count normal hemoglobin, chemistries normal other than mildly low potassium which was repleted, urinalysis without gross signs of infection, troponins negative, normal TSH I ordered medication oral potassium I ordered imaging studies which included chest x-ray and I independently    visualized and interpreted imaging which showed no acute pulmonary disease  Previous records obtained and reviewed in epic including prior ED visits and urgent care visit along with employee health visit recently.   After the interventions stated above, I reevaluated the patient and found patient to be minimally symptomatic.  Blood pressure has been elevated here.  Recommended she follow-up with her primary care doctor for continued management of her blood pressure and further work-up of her symptoms.  Return instructions discussed.   Final Clinical Impression(s) / ED Diagnoses Final diagnoses:  Dizziness  Dehydration  Nonspecific chest pain  Hypokalemia    Rx / DC Orders ED Discharge Orders    None       Terrilee Files, MD 04/04/20 1230

## 2020-04-03 NOTE — Discharge Instructions (Signed)
You were seen in the emergency department for intermittent dizziness lightheadedness chest pain weakness.  You had lab work that did not show any significant abnormalities.  You are not anemic, your kidney function is normal, your thyroid test was normal and your heart test did not show any evidence of heart injury.  Your chest x-ray and EKG were unremarkable.  Your urinalysis was concentrated going along with some mild dehydration.  Your potassium was mildly low and you were given a potassium supplement.  Please continue to try to stay well-hydrated and follow-up with your primary care doctor as scheduled.  Return to the emergency department if any worsening or concerning symptoms.

## 2020-04-03 NOTE — ED Triage Notes (Addendum)
States she has been having on and off back pain, dizziness, weakness and chest pain for a couple months, she has been several times for same since August.

## 2020-04-08 ENCOUNTER — Ambulatory Visit: Payer: PRIVATE HEALTH INSURANCE | Admitting: Emergency Medicine

## 2020-04-17 ENCOUNTER — Other Ambulatory Visit: Payer: Self-pay

## 2020-04-17 ENCOUNTER — Encounter: Payer: Self-pay | Admitting: Family Medicine

## 2020-04-17 ENCOUNTER — Ambulatory Visit (INDEPENDENT_AMBULATORY_CARE_PROVIDER_SITE_OTHER): Payer: BC Managed Care – PPO | Admitting: Family Medicine

## 2020-04-17 VITALS — BP 129/96 | HR 88 | Temp 98.4°F | Ht 67.0 in | Wt 180.0 lb

## 2020-04-17 DIAGNOSIS — F419 Anxiety disorder, unspecified: Secondary | ICD-10-CM

## 2020-04-17 DIAGNOSIS — Z1159 Encounter for screening for other viral diseases: Secondary | ICD-10-CM

## 2020-04-17 DIAGNOSIS — Z23 Encounter for immunization: Secondary | ICD-10-CM

## 2020-04-17 DIAGNOSIS — I10 Essential (primary) hypertension: Secondary | ICD-10-CM

## 2020-04-17 DIAGNOSIS — Z114 Encounter for screening for human immunodeficiency virus [HIV]: Secondary | ICD-10-CM

## 2020-04-17 DIAGNOSIS — R42 Dizziness and giddiness: Secondary | ICD-10-CM

## 2020-04-17 DIAGNOSIS — F321 Major depressive disorder, single episode, moderate: Secondary | ICD-10-CM | POA: Diagnosis not present

## 2020-04-17 MED ORDER — MECLIZINE HCL 25 MG PO TABS: 25.0000 mg | ORAL_TABLET | Freq: Three times a day (TID) | ORAL | 0 refills | Status: AC | PRN

## 2020-04-17 MED ORDER — HYDROCHLOROTHIAZIDE 25 MG PO TABS
25.0000 mg | ORAL_TABLET | Freq: Every day | ORAL | 3 refills | Status: AC
Start: 2020-04-17 — End: ?

## 2020-04-17 MED ORDER — ESCITALOPRAM OXALATE 10 MG PO TABS: 10.0000 mg | ORAL_TABLET | Freq: Every day | ORAL | 0 refills | Status: DC

## 2020-04-17 NOTE — Patient Instructions (Addendum)
Set up my chart when able Will follow up with labs when results available Take 1 tab of meclizine as needed for dizziness Take Lexapro 10mg  tab once per day   Benign Positional Vertigo Vertigo is the feeling that you or your surroundings are moving when they are not. Benign positional vertigo is the most common form of vertigo. This is usually a harmless condition (benign). This condition is positional. This means that symptoms are triggered by certain movements and positions. This condition can be dangerous if it occurs while you are doing something that could cause harm to you or others. This includes activities such as driving or operating machinery. What are the causes? In many cases, the cause of this condition is not known. It may be caused by a disturbance in an area of the inner ear that helps your brain to sense movement and balance. This disturbance can be caused by:  Viral infection (labyrinthitis).  Head injury.  Repetitive motion, such as jumping, dancing, or running. What increases the risk? You are more likely to develop this condition if:  You are a woman.  You are 74 years of age or older. What are the signs or symptoms? Symptoms of this condition usually happen when you move your head or your eyes in different directions. Symptoms may start suddenly, and usually last for less than a minute. They include:  Loss of balance and falling.  Feeling like you are spinning or moving.  Feeling like your surroundings are spinning or moving.  Nausea and vomiting.  Blurred vision.  Dizziness.  Involuntary eye movement (nystagmus). Symptoms can be mild and cause only minor problems, or they can be severe and interfere with daily life. Episodes of benign positional vertigo may return (recur) over time. Symptoms may improve over time. How is this diagnosed? This condition may be diagnosed based on:  Your medical history.  Physical exam of the head, neck, and  ears.  Tests, such as: ? MRI. ? CT scan. ? Eye movement tests. Your health care provider may ask you to change positions quickly while he or she watches you for symptoms of benign positional vertigo, such as nystagmus. Eye movement may be tested with a variety of exams that are designed to evaluate or stimulate vertigo. ? An electroencephalogram (EEG). This records electrical activity in your brain. ? Hearing tests. You may be referred to a health care provider who specializes in ear, nose, and throat (ENT) problems (otolaryngologist) or a provider who specializes in disorders of the nervous system (neurologist). How is this treated?  This condition may be treated in a session in which your health care provider moves your head in specific positions to adjust your inner ear back to normal. Treatment for this condition may take several sessions. Surgery may be needed in severe cases, but this is rare. In some cases, benign positional vertigo may resolve on its own in 2-4 weeks. Follow these instructions at home: Safety  Move slowly. Avoid sudden body or head movements or certain positions, as told by your health care provider.  Avoid driving until your health care provider says it is safe for you to do so.  Avoid operating heavy machinery until your health care provider says it is safe for you to do so.  Avoid doing any tasks that would be dangerous to you or others if vertigo occurs.  If you have trouble walking or keeping your balance, try using a cane for stability. If you feel dizzy or unstable, sit  down right away.  Return to your normal activities as told by your health care provider. Ask your health care provider what activities are safe for you. General instructions  Take over-the-counter and prescription medicines only as told by your health care provider.  Drink enough fluid to keep your urine pale yellow.  Keep all follow-up visits as told by your health care provider. This  is important. Contact a health care provider if:  You have a fever.  Your condition gets worse or you develop new symptoms.  Your family or friends notice any behavioral changes.  You have nausea or vomiting that gets worse.  You have numbness or a "pins and needles" sensation. Get help right away if you:  Have difficulty speaking or moving.  Are always dizzy.  Faint.  Develop severe headaches.  Have weakness in your legs or arms.  Have changes in your hearing or vision.  Develop a stiff neck.  Develop sensitivity to light. Summary  Vertigo is the feeling that you or your surroundings are moving when they are not. Benign positional vertigo is the most common form of vertigo.  The cause of this condition is not known. It may be caused by a disturbance in an area of the inner ear that helps your brain to sense movement and balance.  Symptoms include loss of balance and falling, feeling that you or your surroundings are moving, nausea and vomiting, and blurred vision.  This condition can be diagnosed based on symptoms, physical exam, and other tests, such as MRI, CT scan, eye movement tests, and hearing tests.  Follow safety instructions as told by your health care provider. You will also be told when to contact your health care provider in case of problems. This information is not intended to replace advice given to you by your health care provider. Make sure you discuss any questions you have with your health care provider. Document Revised: 11/16/2017 Document Reviewed: 11/16/2017 Elsevier Patient Education  2020 Elsevier Inc.  Major Depressive Disorder, Adult Major depressive disorder (MDD) is a mental health condition. MDD often makes you feel sad, hopeless, or helpless. MDD can also cause symptoms in your body. MDD can affect your:  Work.  School.  Relationships.  Other normal activities. MDD can range from mild to very bad. It may occur once (single episode  MDD). It can also occur many times (recurrent MDD). The main symptoms of MDD often include:  Feeling sad, depressed, or irritable most of the time.  Loss of interest. MDD symptoms also include:  Sleeping too much or too little.  Eating too much or too little.  A change in your weight.  Feeling tired (fatigue) or having low energy.  Feeling worthless.  Feeling guilty.  Trouble making decisions.  Trouble thinking clearly.  Thoughts of suicide or harming others.  Feeling weak.  Feeling agitated.  Keeping yourself from being around other people (isolation). Follow these instructions at home: Activity  Do these things as told by your doctor: ? Go back to your normal activities. ? Exercise regularly. ? Spend time outdoors. Alcohol  Talk with your doctor about how alcohol can affect your antidepressant medicines.  Do not drink alcohol. Or, limit how much alcohol you drink. ? This means no more than 1 drink a day for nonpregnant women and 2 drinks a day for men. One drink equals one of these:  12 oz of beer.  5 oz of wine.  1 oz of hard liquor. General instructions  Take  over-the-counter and prescription medicines only as told by your doctor.  Eat a healthy diet.  Get plenty of sleep.  Find activities that you enjoy. Make time to do them.  Think about joining a support group. Your doctor may be able to suggest a group for you.  Keep all follow-up visits as told by your doctor. This is important. Where to find more information:  [employer] on Mental Illness: ? www.nami.org  [place] [employer] of Mental Health: ? http://www.maynard.net/  National Suicide Prevention Lifeline: ? (409) 809-5806. This is free, 24-hour help. Contact a doctor if:  Your symptoms get worse.  You have new symptoms. Get help right away if:  You self-harm.  You see, hear, taste, smell, or feel things that are not present (hallucinate). If you ever feel like you may  hurt yourself or others, or have thoughts about taking your own life, get help right away. You can go to your nearest emergency department or call:  Your local emergency services (911 in the [place]  A suicide crisis helpline, such as the National Suicide Prevention Lifeline: ? (661)053-3244. This is open 24 hours a day. This information is not intended to replace advice given to you by your health care provider. Make sure you discuss any questions you have with your health care provider. Document Revised: 05/20/2017 Document Reviewed: 02/22/2016 Elsevier Patient Education  [employer].    If you have lab work done today you will be contacted with your lab results within the next 2 weeks.  If you have not heard from [place] then please contact us. The fastest way to get your results is to register for My Chart.   IF you received an x-ray today, you will receive an invoice from [facility]. Please contact [facility] at [phone] with questions or concerns regarding your invoice.   IF you received labwork today, you will receive an invoice from [facility]. Please contact [facility] at [phone] with questions or concerns regarding your invoice.   Our billing staff will not be able to assist you with questions regarding bills from these companies.  You will be contacted with the lab results as soon as they are available. The fastest way to get your results is to activate your My Chart account. Instructions are located on the last page of this paperwork. If you have not heard from [place] regarding the results in 2 weeks, please contact this office.

## 2020-04-17 NOTE — Progress Notes (Addendum)
10/28/20218:51 AM  Kiara Klein [place] 04/17/1986, 34 y.o., female [id number]  Chief Complaint  Patient presents with  . Hypertension    dizzynes, weakness in legs , chest pains, back pains   . Insomnia    anxiety     HPI:   Patient is a 34 y.o. female with past medical history significant for HTN, Depression who presents today for HTN and insomnia  2nd week of August Light headed and dizzy, tinitus  Legs felt numb and tingling Then she felt better the next day This repeated 8/18 and again improved. End of August has been having episodes of tired, weak, lightheaded, dizzy like she may faint, insomnia, hurts everywhere Dizzy frequently when lay down Works 2 jobs, can't turn brain off, anxiety That is normal for her but worse now Nausea started last Sunday Almost Everyday   LMP: Marena Chancy (She doesn't track cycles)  Periods regular prior to the depo Had been On Depo 18 years and stopped that in February No contraception, not trying to get pregnant  Screening:  Pap 2 years ago normal, due next year. Covid: done Flu: today  Hypertension HCTZ 42m Goal 140/90 BP Readings from Last 3 Encounters:  04/17/20 (!) 129/96  04/03/20 (!) 146/109  03/25/20 124/88   Depression Zoloft 552m  Never started this due to medication safety concerns  [facility] 03/25/2020 09/09/2016 08/30/2015  Decreased Interest 0 0 0  Down, Depressed, Hopeless 2 0 0  PHQ - 2 Score 2 0 0  Altered sleeping 3 - -  Tired, decreased energy 3 - -  Change in appetite 2 - -  Feeling bad or failure about yourself  0 - -  Trouble concentrating 2 - -  Moving slowly or fidgety/restless 3 - -  Suicidal thoughts 0 - -  PHQ-9 Score 15 - -  Difficult doing work/chores Somewhat difficult - -    Fall Risk  04/17/2020 09/09/2016  Falls in the past year? 0 No  Number falls in past yr: 0 -  Injury with Fall? 0 -  Follow up Falls evaluation completed -     Allergies  Allergen Reactions  .  Bactrim [Sulfamethoxazole-Trimethoprim] Hives  . Penicillins Hives    Has patient had a PCN reaction causing immediate rash, facial/tongue/throat swelling, SOB or lightheadedness with hypotension: No Has patient had a PCN reaction causing severe rash involving mucus membranes or skin necrosis: Yes Has patient had a PCN reaction that required hospitalization Yes Has patient had a PCN reaction occurring within the last 10 years: No If all of the above answers are "NO", then may proceed with Cephalosporin use.     Prior to Admission medications   Medication Sig Start Date End Date Taking? Authorizing Provider  hydrochlorothiazide (HYDRODIURIL) 25 MG tablet Take 1 tablet (25 mg total) by mouth daily. 02/06/20  Yes KoVirgel ManifoldMD  sertraline (ZOLOFT) 50 MG tablet Take 1 tablet (50 mg total) by mouth daily. Start by taking 1/2 tablet daily x 1-2 weeks. Then increase to full tablet daily. Patient not taking: Reported on 04/17/2020 03/25/20   WeCheyenne AdasNP    Past Medical History:  Diagnosis Date  . Anxiety   . Depression   . HTN (hypertension)     No past surgical history on file.  Social History   Tobacco Use  . Smoking status: Never Smoker  . Smokeless tobacco: Never Used  Substance Use Topics  . Alcohol use: Yes    Alcohol/week: 0.0 standard drinks  Comment: occ    Family History  Problem Relation Age of Onset  . Diabetes Mother   . Heart disease Mother   . Hypertension Mother   . Hypertension Brother     Review of Systems  Constitutional: Negative for chills, fever, malaise/fatigue and weight loss.  HENT: Positive for tinnitus. Negative for congestion, ear pain, hearing loss, sinus pain and sore throat.   Eyes: Negative for blurred vision, double vision and pain.  Respiratory: Negative for cough, shortness of breath and wheezing.   Cardiovascular: Negative for chest pain, palpitations and leg swelling.  Gastrointestinal: Positive for constipation. Negative  for abdominal pain, blood in stool, diarrhea, heartburn, nausea and vomiting.  Genitourinary: Negative for dysuria, frequency and hematuria.  Musculoskeletal: Negative for back pain, joint pain and neck pain.  Skin: Positive for rash.  Neurological: Positive for dizziness. Negative for weakness and headaches.  Endo/Heme/Allergies: Does not bruise/bleed easily.  Psychiatric/Behavioral: Positive for depression. Negative for suicidal ideas. The patient is nervous/anxious.      OBJECTIVE:  Today's Vitals   04/17/20 0802  BP: (!) 129/96  Pulse: 88  Temp: 98.4 F (36.9 C)  SpO2: 98%  Weight: 180 lb (81.6 kg)  Height: '5\' 7"'  (1.702 m)   Body mass index is 28.19 kg/m.   Physical Exam Constitutional:      General: She is not in acute distress.    Appearance: Normal appearance. She is not ill-appearing.  HENT:     Head: Normocephalic.  Cardiovascular:     Rate and Rhythm: Normal rate and regular rhythm.     Pulses: Normal pulses.     Heart sounds: Normal heart sounds. No murmur heard.  No friction rub. No gallop.   Pulmonary:     Effort: Pulmonary effort is normal. No respiratory distress.     Breath sounds: Normal breath sounds. No stridor. No wheezing, rhonchi or rales.  Abdominal:     General: Bowel sounds are normal.     Palpations: Abdomen is soft.     Tenderness: There is no abdominal tenderness.  Musculoskeletal:     Cervical back: Normal range of motion and neck supple. No rigidity or tenderness.     Right lower leg: No edema.     Left lower leg: No edema.  Skin:    General: Skin is warm and dry.  Neurological:     General: No focal deficit present.     Mental Status: She is alert and oriented to person, place, and time.     Sensory: No sensory deficit.     Motor: No weakness.  Psychiatric:        Mood and Affect: Mood normal.        Behavior: Behavior normal.     No results found for this or any previous visit (from the past 24 hour(s)).  No results  found.   ASSESSMENT and PLAN  Problem List Items Addressed This Visit      Other   Depression, major, single episode, moderate (HCC)   Relevant Medications   escitalopram (LEXAPRO) 10 MG tablet   Other Relevant Orders   Vitamin D, 25-hydroxy   Anxiety   Relevant Medications   escitalopram (LEXAPRO) 10 MG tablet daily Will follow up at next appointment Discussed r/se/b    Other Visit Diagnoses    Dizziness    -  Primary   Relevant Medications   meclizine (ANTIVERT) 25 MG tablet: PRN for dizziness and nausea   Other Relevant Orders   Ambulatory  referral to Neurology   Hemoglobin A1c   Essential hypertension       Relevant Medications   hydrochlorothiazide (HYDRODIURIL) 25 MG tablet daily At goal< 140/90 Discussed home BP monitoring   Other Relevant Orders   Lipid Panel   CMP14+EGFR   Need for prophylactic vaccination and inoculation against influenza       Relevant Orders   Flu Vaccine QUAD 36+ mos IM   Encounter for hepatitis C screening test for low risk patient       Relevant Orders   Hepatitis C antibody   Encounter for screening for HIV       Relevant Orders   HIV Antibody (routine testing w rflx)     Will follow up with lab results  Return in about 6 weeks (around 05/29/2020) for Depression medication eval.    Huston Foley Damarkus Balis, FNP-BC [facility] at [facility] [place], [place] [address] Ph.  [phone] Fax [phone]  I have reviewed and agree with above documentation. Agustina Caroli, MD

## 2020-04-18 LAB — CMP14+EGFR
ALT: 13 IU/L (ref 0–32)
AST: 15 IU/L (ref 0–40)
Albumin/Globulin Ratio: 1.4 (ref 1.2–2.2)
Albumin: 4.2 g/dL (ref 3.8–4.8)
Alkaline Phosphatase: 74 IU/L (ref 44–121)
BUN/Creatinine Ratio: 7 — ABNORMAL LOW (ref 9–23)
BUN: 7 mg/dL (ref 6–20)
Bilirubin Total: 0.6 mg/dL (ref 0.0–1.2)
CO2: 21 mmol/L (ref 20–29)
Calcium: 9.4 mg/dL (ref 8.7–10.2)
Chloride: 104 mmol/L (ref 96–106)
Creatinine, Ser: 0.96 mg/dL (ref 0.57–1.00)
GFR calc Af Amer: 89 mL/min/{1.73_m2} (ref >59)
GFR calc non Af Amer: 77 mL/min/{1.73_m2} (ref >59)
Globulin, Total: 3.1 g/dL (ref 1.5–4.5)
Glucose: 95 mg/dL (ref 65–99)
Potassium: 3.6 mmol/L (ref 3.5–5.2)
Sodium: 141 mmol/L (ref 134–144)
Total Protein: 7.3 g/dL (ref 6.0–8.5)

## 2020-04-18 LAB — LIPID PANEL
Chol/HDL Ratio: 3.5 ratio (ref 0.0–4.4)
Cholesterol, Total: 187 mg/dL (ref 100–199)
HDL: 54 mg/dL (ref >39)
LDL Chol Calc (NIH): 119 mg/dL — ABNORMAL HIGH (ref 0–99)
Triglycerides: 74 mg/dL (ref 0–149)
VLDL Cholesterol Cal: 14 mg/dL (ref 5–40)

## 2020-04-18 LAB — HIV ANTIBODY (ROUTINE TESTING W REFLEX): HIV Screen 4th Generation wRfx: NONREACTIVE

## 2020-04-18 LAB — HEMOGLOBIN A1C
Est. average glucose Bld gHb Est-mCnc: 114 mg/dL
Hgb A1c MFr Bld: 5.6 % (ref 4.8–5.6)

## 2020-04-18 LAB — VITAMIN D 25 HYDROXY (VIT D DEFICIENCY, FRACTURES): Vit D, 25-Hydroxy: 12.6 ng/mL — ABNORMAL LOW (ref 30.0–100.0)

## 2020-04-18 LAB — HEPATITIS C ANTIBODY: Hep C Virus Ab: <0.1 s/co ratio (ref 0.0–0.9)

## 2020-04-18 NOTE — Progress Notes (Signed)
Hello, If you could let Kiara Klein know overall her labs look really good. Her cholesterol is slightly elevated but with diet and exercise that should improve.  Her vitamin D is low. I would recommend her starting to take 2000 IU of over the counter vitamin D replacement with food. We can recheck this at her next appointment. Thanks!

## 2020-04-22 ENCOUNTER — Encounter (HOSPITAL_COMMUNITY): Payer: Self-pay

## 2020-04-22 ENCOUNTER — Other Ambulatory Visit: Payer: Self-pay

## 2020-04-22 ENCOUNTER — Emergency Department (HOSPITAL_COMMUNITY)
Admission: EM | Admit: 2020-04-22 | Discharge: 2020-04-22 | Disposition: A | Discharge status: Home / Self Care | Payer: BC Managed Care – PPO | Attending: Emergency Medicine | Admitting: Emergency Medicine

## 2020-04-22 DIAGNOSIS — R110 Nausea: Secondary | ICD-10-CM

## 2020-04-22 DIAGNOSIS — I10 Essential (primary) hypertension: Secondary | ICD-10-CM | POA: Insufficient documentation

## 2020-04-22 DIAGNOSIS — K2960 Other gastritis without bleeding: Secondary | ICD-10-CM | POA: Insufficient documentation

## 2020-04-22 DIAGNOSIS — K2900 Acute gastritis without bleeding: Secondary | ICD-10-CM

## 2020-04-22 MED ORDER — ONDANSETRON 4 MG PO TBDP
4.0000 mg | ORAL_TABLET | Freq: Once | ORAL | Status: AC
  Administered 2020-04-22: 4 mg via ORAL
  Filled 2020-04-22: qty 1

## 2020-04-22 MED ORDER — LIDOCAINE VISCOUS HCL 2 % MT SOLN
15.0000 mL | Freq: Once | OROMUCOSAL | Status: AC
  Administered 2020-04-22: 15 mL via ORAL
  Filled 2020-04-22: qty 15

## 2020-04-22 MED ORDER — FAMOTIDINE 20 MG PO TABS: 20.0000 mg | ORAL_TABLET | Freq: Every day | ORAL | 0 refills | Status: DC

## 2020-04-22 MED ORDER — ALUM & MAG HYDROXIDE-SIMETH 200-200-20 MG/5ML PO SUSP
30.0000 mL | Freq: Once | ORAL | Status: AC
  Administered 2020-04-22: 30 mL via ORAL
  Filled 2020-04-22: qty 30

## 2020-04-22 MED ORDER — ONDANSETRON 4 MG PO TBDP: 4.0000 mg | ORAL_TABLET | Freq: Three times a day (TID) | ORAL | 0 refills | Status: AC | PRN

## 2020-04-22 NOTE — ED Triage Notes (Signed)
Patient arrived with complaints of dizziness, numbness/tingling in bilateral legs. Reports feel nauseated two weeks ago.

## 2020-04-22 NOTE — ED Provider Notes (Signed)
[facility]-EMERGENCY DEPT Provider Note  CSN: [id number] Arrival date & time: 04/22/20 2130  Chief Complaint(s) No chief complaint on file.  HPI Kiara Klein is a 34 y.o. female   CC: nausea  Onset/Duration: 2 weeks Timing: daily Severity: mild to moderate Modifying Factors:  Improved by: nothing  Worsened by: eating and drinking Associated Signs/Symptoms:  Pertinent (+): decreased appetite. constipation  Pertinent (-): fever, chills, chest pain, SOB, abd pain, emesis,  Context: Patient also noting 2 months of BUE and BLE paresthesia and lightheadedness, currently followed by PCP - referred to neurology.    The history is provided by the patient.    Past Medical History Past Medical History:  Diagnosis Date  . Anxiety   . Depression   . HTN (hypertension)    Patient Active Problem List   Diagnosis Date Noted  . Depression, major, single episode, moderate (HCC) 03/25/2020  . Anxiety 03/25/2020  . Tibialis tendinitis of right lower extremity 10/11/2016   Home Medication(s) Prior to Admission medications   Medication Sig Start Date End Date Taking? Authorizing Provider  escitalopram (LEXAPRO) 10 MG tablet Take 1 tablet (10 mg total) by mouth daily. 04/17/20   Just, [facility] Course, FNP  famotidine (PEPCID) 20 MG tablet Take 1 tablet (20 mg total) by mouth daily. 04/22/20 05/22/20  Nira Conn, MD  hydrochlorothiazide (HYDRODIURIL) 25 MG tablet Take 1 tablet (25 mg total) by mouth daily. 04/17/20   Just, [facility] Course, FNP  meclizine (ANTIVERT) 25 MG tablet Take 1 tablet (25 mg total) by mouth 3 (three) times daily as needed for dizziness. 04/17/20   Just, [facility] Course, FNP  ondansetron (ZOFRAN ODT) 4 MG disintegrating tablet Take 1 tablet (4 mg total) by mouth every 8 (eight) hours as needed for up to 3 days for nausea or vomiting. 04/22/20 04/25/20  Nira Conn, MD                                                                                                                                     Past Surgical History History reviewed. No pertinent surgical history. Family History Family History  Problem Relation Age of Onset  . Diabetes Mother   . Heart disease Mother   . Hypertension Mother   . Hypertension Brother     Social History Social History   Tobacco Use  . Smoking status: Never Smoker  . Smokeless tobacco: Never Used  Vaping Use  . Vaping Use: Never used  Substance Use Topics  . Alcohol use: Yes    Alcohol/week: 0.0 standard drinks    Comment: occ  . Drug use: No   Allergies Bactrim [sulfamethoxazole-trimethoprim] and Penicillins  Review of Systems Review of Systems All other systems are reviewed and are negative for acute change except as noted in the HPI  Physical Exam Vital Signs  I have reviewed the triage vital signs BP (!) 134/116 (BP Location:  Left Arm)   Pulse 99   Temp 98.8 F (37.1 C) (Oral)   Resp 19   Ht 5\' 6"  (1.676 m)   Wt 81.6 kg   LMP 04/21/2020   SpO2 99%   BMI 29.05 kg/m   Physical Exam Vitals reviewed.  Constitutional:      General: She is not in acute distress.    Appearance: She is well-developed. She is not diaphoretic.  HENT:     Head: Normocephalic and atraumatic.     Nose: Nose normal.  Eyes:     General: No scleral icterus.       Right eye: No discharge.        Left eye: No discharge.     Conjunctiva/sclera: Conjunctivae normal.     Pupils: Pupils are equal, round, and reactive to light.  Cardiovascular:     Rate and Rhythm: Normal rate and regular rhythm.     Heart sounds: No murmur heard.  No friction rub. No gallop.   Pulmonary:     Effort: Pulmonary effort is normal. No respiratory distress.     Breath sounds: Normal breath sounds. No stridor. No rales.  Abdominal:     General: There is no distension.     Palpations: Abdomen is soft.     Tenderness: There is no abdominal tenderness.  Musculoskeletal:        General: No tenderness.      Cervical back: Normal range of motion and neck supple.  Skin:    General: Skin is warm and dry.     Findings: No erythema or rash.  Neurological:     Mental Status: She is alert and oriented to person, place, and time.     ED Results and Treatments Labs (all labs ordered are listed, but only abnormal results are displayed) Labs Reviewed - No data to display                                                                                                                       EKG  EKG Interpretation  Date/Time:    Ventricular Rate:    PR Interval:    QRS Duration:   QT Interval:    QTC Calculation:   R Axis:     Text Interpretation:        Radiology No results found.  Pertinent labs & imaging results that were available during my care of the patient were reviewed by me and considered in my medical decision making (see chart for details).  Medications Ordered in ED Medications  alum & mag hydroxide-simeth (MAALOX/MYLANTA) 200-200-20 MG/5ML suspension 30 mL (30 mLs Oral Given 04/22/20 0625)    And  lidocaine (XYLOCAINE) 2 % viscous mouth solution 15 mL (15 mLs Oral Given 04/22/20 0625)  ondansetron (ZOFRAN-ODT) disintegrating tablet 4 mg (4 mg Oral Given 04/22/20 0624)  Procedures Procedures  (including critical care time)  Medical Decision Making / ED Course I have reviewed the nursing notes for this encounter and the patient's prior records (if available in EHR or on provided paperwork).   Kiara Klein was evaluated in Emergency Department on 04/22/2020 for the symptoms described in the history of present illness. She was evaluated in the context of the global COVID-19 pandemic, which necessitated consideration that the patient might be at risk for infection with the SARS-CoV-2 virus that causes COVID-19. Institutional protocols and  algorithms that pertain to the evaluation of patients at risk for COVID-19 are in a state of rapid change based on information released by regulatory bodies including the CDC and federal and state organizations. These policies and algorithms were followed during the patient's care in the ED.  Patient has had extensive work up regarding her lightheadedness and paresthesia. Her nausea is only with PO intake. abd exam benign. Nausea resolved with zofran and GI cocktail. Tolerating PO intake. No need for labs or imaging at this time.      Final Clinical Impression(s) / ED Diagnoses Final diagnoses:  Nausea  Other acute gastritis without hemorrhage   The patient appears reasonably screened and/or stabilized for discharge and I doubt any other medical condition or other [facility] requiring further screening, evaluation, or treatment in the ED at this time prior to discharge. Safe for discharge with strict return precautions.  Disposition: Discharge  Condition: Good  I have discussed the results, Dx and Tx plan with the patient/family who expressed understanding and agree(s) with the plan. Discharge instructions discussed at length. The patient/family was given strict return precautions who verbalized understanding of the instructions. No further questions at time of discharge.    ED Discharge Orders         Ordered    famotidine (PEPCID) 20 MG tablet  Daily        04/22/20 0707    ondansetron (ZOFRAN ODT) 4 MG disintegrating tablet  Every 8 hours PRN        04/22/20 0711         Follow Up: Just, Azalee Course, FNP [address] [place] [address] [phone]  Schedule an appointment as soon as possible for a visit  As needed      This chart was dictated using voice recognition software.  Despite best efforts to proofread,  errors can occur which can change the documentation meaning.   Nira Conn, MD 04/22/20 [phone]

## 2020-04-28 ENCOUNTER — Ambulatory Visit: Payer: PRIVATE HEALTH INSURANCE | Admitting: Family Medicine

## 2020-05-29 ENCOUNTER — Ambulatory Visit: Payer: BC Managed Care – PPO | Admitting: Family Medicine

## 2020-07-03 ENCOUNTER — Encounter: Payer: Self-pay | Admitting: Neurology

## 2020-07-03 ENCOUNTER — Ambulatory Visit (INDEPENDENT_AMBULATORY_CARE_PROVIDER_SITE_OTHER): Payer: BC Managed Care – PPO | Admitting: Neurology

## 2020-07-03 VITALS — BP 107/75 | HR 78 | Ht 66.0 in | Wt 183.5 lb

## 2020-07-03 DIAGNOSIS — R202 Paresthesia of skin: Secondary | ICD-10-CM | POA: Diagnosis not present

## 2020-07-03 DIAGNOSIS — R42 Dizziness and giddiness: Secondary | ICD-10-CM

## 2020-07-03 NOTE — Progress Notes (Signed)
Chief Complaint  Patient presents with  . New Patient (Initial Visit)    Orthostatic Vitals: Lying: 107/75, 78, Sitting: 118/85, 77, Standing: 118/87/88, Standing x 3: 121/92, 88. Last August, she has 2.5 months of dizziness, blurred vision, numbness/tingling in her legs, restlessness, nausea, ringing in ears and fatigue. Symptoms have improved with the exception of feeling dizziness when rising quickly from a lying or sitting position.      HISTORICAL  Kiara Klein is a 35 year old female, seen in request by her primary care physician Dr. Tracey Harries for evaluation of constellation of complaints, lightheadedness, paresthesia, initial evaluation was on July 03, 2021.  I reviewed and summarized the referring note.  She works as a Sports administrator, in August 2021, she began to experience frequent symptoms, presented to the emergency room multiple times from August to November 2021, dizziness, described as lightheadedness when standing up from seated position, shortness of breath, chest pain, subjective weakness, but she denies difficulty handling her job including patient, denied visual difficulty, denies gait abnormality, no bowel or bladder incontinence  Her symptoms are transient, only lasting for few minutes, no limitation in her function, she also complains of feeling full sensation, worries about her symptoms,  She was eventually put on anxiety medications Lexapro since November 2021, which has helped her symptoms  I reviewed multiple emergency room visit Laboratory evaluation in October 2021: Normal CMP, A1c 5.6, negative HIV, hepatitis C, lipid panel showed LDL of 119, vitamin D of 12.6, CBC hemoglobin of 15.1, TSH  REVIEW OF SYSTEMS: Full 14 system review of systems performed and notable only for as above All other review of systems were negative.  ALLERGIES: Allergies  Allergen Reactions  . Bactrim [Sulfamethoxazole-Trimethoprim] Hives  . Penicillins  Hives    Has patient had a PCN reaction causing immediate rash, facial/tongue/throat swelling, SOB or lightheadedness with hypotension: No Has patient had a PCN reaction causing severe rash involving mucus membranes or skin necrosis: Yes Has patient had a PCN reaction that required hospitalization Yes Has patient had a PCN reaction occurring within the last 10 years: No If all of the above answers are "NO", then may proceed with Cephalosporin use.     HOME MEDICATIONS: Current Outpatient Medications  Medication Sig Dispense Refill  . escitalopram (LEXAPRO) 10 MG tablet Take 1 tablet (10 mg total) by mouth daily. 90 tablet 0  . hydrochlorothiazide (HYDRODIURIL) 25 MG tablet Take 1 tablet (25 mg total) by mouth daily. 90 tablet 3  . meclizine (ANTIVERT) 25 MG tablet Take 1 tablet (25 mg total) by mouth 3 (three) times daily as needed for dizziness. 30 tablet 0   No current facility-administered medications for this visit.    PAST MEDICAL HISTORY: Past Medical History:  Diagnosis Date  . Anxiety   . Depression   . Dizziness   . HTN (hypertension)     PAST SURGICAL HISTORY: History reviewed. No pertinent surgical history.  FAMILY HISTORY: Family History  Problem Relation Age of Onset  . Diabetes Mother   . Heart disease Mother   . Hypertension Mother   . Prostate cancer Father   . Hypertension Brother     SOCIAL HISTORY: Social History   Socioeconomic History  . Marital status: Single    Spouse name: Not on file  . Number of children: Not on file  . Years of education: college  . Highest education level: Associate degree: academic program  Occupational History  . Occupation: patient coordinator/CMA/Med Tech  Tobacco  Use  . Smoking status: Never Smoker  . Smokeless tobacco: Never Used  Vaping Use  . Vaping Use: Never used  Substance and Sexual Activity  . Alcohol use: Yes    Alcohol/week: 0.0 standard drinks    Comment: occ  . Drug use: No  . Sexual activity:  Yes    Birth control/protection: None  Other Topics Concern  . Not on file  Social History Narrative   Lives at home with daughter and grandson.   Right-handed.   Caffeine use: coffee 2-3 times per week.   Social Determinants of Health   Financial Resource Strain: Not on file  Food Insecurity: Not on file  Transportation Needs: Not on file  Physical Activity: Not on file  Stress: Not on file  Social Connections: Not on file  Intimate Partner Violence: Not on file     PHYSICAL EXAM   Vitals:   07/03/20 1111  BP: 107/75  Pulse: 78  Weight: 183 lb 8 oz (83.2 kg)  Height: 5\' 6"  (1.676 m)   Not recorded     Body mass index is 29.62 kg/m.  PHYSICAL EXAMNIATION:  Gen: NAD, conversant, well nourised, well groomed                     Cardiovascular: Regular rate rhythm, no peripheral edema, warm, nontender. Eyes: Conjunctivae clear without exudates or hemorrhage Neck: Supple, no carotid bruits. Pulmonary: Clear to auscultation bilaterally   NEUROLOGICAL EXAM:  MENTAL STATUS: Speech:    Speech is normal; fluent and spontaneous with normal comprehension.  Cognition:     Orientation to time, place and person     Normal recent and remote memory     Normal Attention span and concentration     Normal Language, naming, repeating,spontaneous speech     Fund of knowledge   CRANIAL NERVES: CN II: Visual fields are full to confrontation. Pupils are round equal and briskly reactive to light. CN III, IV, VI: extraocular movement are normal. No ptosis. CN V: Facial sensation is intact to light touch CN VII: Face is symmetric with normal eye closure  CN VIII: Hearing is normal to causal conversation. CN IX, X: Phonation is normal. CN XI: Head turning and shoulder shrug are intact  MOTOR: There is no pronator drift of out-stretched arms. Muscle bulk and tone are normal. Muscle strength is normal.  REFLEXES: Reflexes are 2+ and symmetric at the biceps, triceps, knees, and  ankles. Plantar responses are flexor.  SENSORY: Intact to light touch, pinprick and vibratory sensation are intact in fingers and toes.  COORDINATION: There is no trunk or limb dysmetria noted.  GAIT/STANCE: Posture is normal. Gait is steady with normal steps, base, arm swing, and turning. Heel and toe walking are normal. Tandem gait is normal.  Romberg is absent.   DIAGNOSTIC DATA (LABS, IMAGING, TESTING) - I reviewed patient records, labs, notes, testing and imaging myself where available.   ASSESSMENT AND PLAN  Kiara Klein is a 35 y.o. female    Patient of complaints, including intermittent bilateral lower extremity paresthesia, subjective weakness  In the setting of anxiety, symptoms also improved with treatment of anxiety,  Reported no significant abnormality on recent MRI of the brain  Will hold off further evaluation at this point, continue to observe her symptoms, only return to clinic for new issues  31, M.D. Ph.D.  [facility] [address], [address] [place], [place] Ph: [phone] Fax: [phone]  CC:  Tracey Harries, MD [address] [place],  [place] [address]

## 2020-07-19 ENCOUNTER — Other Ambulatory Visit: Payer: Self-pay | Admitting: Family Medicine

## 2020-07-19 DIAGNOSIS — F419 Anxiety disorder, unspecified: Secondary | ICD-10-CM

## 2020-07-19 DIAGNOSIS — F321 Major depressive disorder, single episode, moderate: Secondary | ICD-10-CM

## 2020-07-19 NOTE — Telephone Encounter (Signed)
Requested medication (s) are due for refill today: yes  Requested medication (s) are on the active medication list: yes  Last refill:  04/17/20  Future visit scheduled: no  Notes to clinic:  pt canceled f/u appt that was scheduled for 05/29/20 - was to f/u for this medication   Requested Prescriptions  Pending Prescriptions Disp Refills   escitalopram (LEXAPRO) 10 MG tablet [Pharmacy Med Name: ESCITALOPRAM 10MG  TABLETS] 90 tablet 0    Sig: TAKE 1 TABLET(10 MG) BY MOUTH DAILY      Psychiatry:  Antidepressants - SSRI Passed - 07/19/2020  9:36 AM      Passed - Completed PHQ-2 or PHQ-9 in the last 360 days      Passed - Valid encounter within last 6 months    Recent Outpatient Visits           3 months ago Dizziness   [facility], [place], FNP   4 years ago Acute maxillary sinusitis, recurrence not specified   [facility] at [facility], Timmothy Euler, MD

## 2021-01-03 ENCOUNTER — Emergency Department (HOSPITAL_COMMUNITY)
Admission: EM | Admit: 2021-01-03 | Discharge: 2021-01-03 | Disposition: A | Discharge status: Home / Self Care | Payer: BC Managed Care – PPO | Attending: Emergency Medicine | Admitting: Emergency Medicine

## 2021-01-03 ENCOUNTER — Encounter (HOSPITAL_COMMUNITY): Payer: Self-pay

## 2021-01-03 ENCOUNTER — Other Ambulatory Visit: Payer: Self-pay

## 2021-01-03 ENCOUNTER — Emergency Department (HOSPITAL_COMMUNITY): Payer: BC Managed Care – PPO

## 2021-01-03 DIAGNOSIS — I10 Essential (primary) hypertension: Secondary | ICD-10-CM | POA: Diagnosis not present

## 2021-01-03 DIAGNOSIS — H9202 Otalgia, left ear: Secondary | ICD-10-CM | POA: Diagnosis not present

## 2021-01-03 DIAGNOSIS — H5712 Ocular pain, left eye: Secondary | ICD-10-CM | POA: Diagnosis not present

## 2021-01-03 DIAGNOSIS — Z79899 Other long term (current) drug therapy: Secondary | ICD-10-CM | POA: Insufficient documentation

## 2021-01-03 DIAGNOSIS — H538 Other visual disturbances: Secondary | ICD-10-CM | POA: Diagnosis not present

## 2021-01-03 DIAGNOSIS — R519 Headache, unspecified: Secondary | ICD-10-CM | POA: Diagnosis not present

## 2021-01-03 DIAGNOSIS — Z8616 Personal history of COVID-19: Secondary | ICD-10-CM | POA: Insufficient documentation

## 2021-01-03 MED ORDER — OXYCODONE-ACETAMINOPHEN 5-325 MG PO TABS
2.0000 | ORAL_TABLET | Freq: Once | ORAL | Status: AC
  Administered 2021-01-03: 2 via ORAL
  Filled 2021-01-03: qty 2

## 2021-01-03 NOTE — Discharge Instructions (Addendum)
You have a normal neurological exam today Your head CT does not show any evidence of abnormality. Please follow-up with your doctor this week for recheck Return if you are having weakness on 1 side or the other, worsening headache, fever, or stiff neck

## 2021-01-03 NOTE — ED Provider Notes (Signed)
[facility]-EMERGENCY DEPT Provider Note   CSN: [id number] Arrival date & time: 01/03/21  1822     History Chief Complaint  Patient presents with   Migraine    Kiara Klein is a 35 y.o. female.  HPI  35 year old female history of hypertension, anxiety, depression, presents today complaining of 1 week.  She describes it as being sharp and stabbing at various locations across her head today it has been more on the left side.  She had some pain in the left below her ear.  She has not noted any fever, congestion, or chills.  She had COVID last year and has had her vaccines and gets tested weekly for work.  She does not think she has had any recent exposures.  She had some pain in her left eye with some blurring of vision.  She has had a history of headaches when she was on Depo-Provera but has been off of it for 2 years and this is greatly improved.  This does not feel like her migraines did with the Depo.  She describes some sharp pain into bilateral temple areas.  She is not having any neck pain and is not on any blood thinners.  She has not had any head trauma.  Past Medical History:  Diagnosis Date   Anxiety    Depression    Dizziness    HTN (hypertension)     Patient Active Problem List   Diagnosis Date Noted   Paresthesia 07/03/2020   Dizziness 07/03/2020   Depression, major, single episode, moderate (HCC) 03/25/2020   Anxiety 03/25/2020   Tibialis tendinitis of right lower extremity 10/11/2016    History reviewed. No pertinent surgical history.   OB History   No obstetric history on file.     Family History  Problem Relation Age of Onset   Diabetes Mother    Heart disease Mother    Hypertension Mother    Prostate cancer Father    Hypertension Brother     Social History   Tobacco Use   Smoking status: Never   Smokeless tobacco: Never  Vaping Use   Vaping Use: Never used  Substance Use Topics   Alcohol use: Yes     Alcohol/week: 0.0 standard drinks    Comment: occ   Drug use: No    Home Medications Prior to Admission medications   Medication Sig Start Date End Date Taking? Authorizing Provider  escitalopram (LEXAPRO) 10 MG tablet Take 1 tablet (10 mg total) by mouth daily. appt required for future refills 07/21/20   Just, Azalee Course, FNP  hydrochlorothiazide (HYDRODIURIL) 25 MG tablet Take 1 tablet (25 mg total) by mouth daily. 04/17/20   Just, Azalee Course, FNP  meclizine (ANTIVERT) 25 MG tablet Take 1 tablet (25 mg total) by mouth 3 (three) times daily as needed for dizziness. 04/17/20   Just, Azalee Course, FNP    Allergies    Bactrim [sulfamethoxazole-trimethoprim] and Penicillins  Review of Systems   Review of Systems  All other systems reviewed and are negative.  Physical Exam Updated Vital Signs BP (!) 128/99 (BP Location: Right Arm)   Pulse 82   Temp 99.3 F (37.4 C) (Oral)   Resp 16   SpO2 100%   Physical Exam Vitals and nursing note reviewed.  Constitutional:      General: She is not in acute distress.    Appearance: She is well-developed.  HENT:     Head: Normocephalic and atraumatic.  Right Ear: External ear normal.     Left Ear: External ear normal.     Nose: Nose normal.     Mouth/Throat:     Mouth: Mucous membranes are moist.     Pharynx: Oropharynx is clear.  Eyes:     General: Lids are normal.     Extraocular Movements: Extraocular movements intact.     Conjunctiva/sclera: Conjunctivae normal.     Pupils: Pupils are equal, round, and reactive to light.     Funduscopic exam:    Right eye: No hemorrhage, arteriolar narrowing or papilledema.        Left eye: No hemorrhage, arteriolar narrowing or papilledema.  Cardiovascular:     Rate and Rhythm: Normal rate and regular rhythm.  Pulmonary:     Effort: Pulmonary effort is normal.  Abdominal:     General: Abdomen is flat. Bowel sounds are normal.     Palpations: Abdomen is soft.  Musculoskeletal:        General:  Normal range of motion.     Cervical back: Normal range of motion and neck supple.  Skin:    General: Skin is warm and dry.     Capillary Refill: Capillary refill takes less than 2 seconds.  Neurological:     General: No focal deficit present.     Mental Status: She is alert and oriented to person, place, and time.     Cranial Nerves: No cranial nerve deficit.     Sensory: No sensory deficit.     Motor: No weakness or abnormal muscle tone.     Coordination: Coordination normal.     Gait: Gait normal.     Deep Tendon Reflexes: Reflexes normal.  Psychiatric:        Mood and Affect: Mood normal.        Behavior: Behavior normal.        Thought Content: Thought content normal.        Judgment: Judgment normal.    ED Results / Procedures / Treatments   Labs (all labs ordered are listed, but only abnormal results are displayed) Labs Reviewed - No data to display  EKG None  Radiology CT Head Wo Contrast  Result Date: 01/03/2021 CLINICAL DATA:  Trigeminal headache. EXAM: CT HEAD WITHOUT CONTRAST TECHNIQUE: Contiguous axial images were obtained from the base of the skull through the vertex without intravenous contrast. COMPARISON:  None. FINDINGS: Brain: No evidence of acute infarction, hemorrhage, hydrocephalus, extra-axial collection or mass lesion/mass effect. Vascular: No hyperdense vessel or unexpected calcification. Skull: Normal. Negative for fracture or focal lesion. Sinuses/Orbits: No acute finding. Other: None. IMPRESSION: No acute intracranial abnormality. Electronically Signed   By: Ted Mcalpine M.D.   On: 01/03/2021 19:16    Procedures Procedures   Medications Ordered in ED Medications  oxyCODONE-acetaminophen (PERCOCET/ROXICET) 5-325 MG per tablet 2 tablet (has no administration in time range)    ED Course  I have reviewed the triage vital signs and the nursing notes.  Pertinent labs & imaging results that were available during my care of the patient were  reviewed by me and considered in my medical decision making (see chart for details).    MDM Rules/Calculators/A&P                          35 year old female with sharp stabbing headache for the past week.  No evidence of infection.  Eye exam is normal.  No tenderness in the temporal areas-low index  of suspicion for temporal arteritis.  No tenderness over the globes and no indication of increased intraocular pressure-low index of suspicion for acute angle-closure glaucoma Low suspicion of bleeding or infection Discussed risk benefits with the patient and will obtain head CT. Plan Percocet for pain Patient advised that she will need follow-up with her primary care physician if she continues to have ongoing symptoms and she voices understanding. Final Clinical Impression(s) / ED Diagnoses Final diagnoses:  None    Rx / DC Orders ED Discharge Orders     None        Margarita Grizzle, MD 01/03/21 1924

## 2021-01-03 NOTE — ED Triage Notes (Signed)
Pt reports headache x1 week with nausea and blurred vision to left eye. Pt reports left sided facial numbness for 2 days that has resolved since then. No facial droops. Grips, strength, and sensation equal bilaterally.

## 2021-01-07 DIAGNOSIS — R200 Anesthesia of skin: Secondary | ICD-10-CM | POA: Diagnosis not present

## 2021-01-07 DIAGNOSIS — F321 Major depressive disorder, single episode, moderate: Secondary | ICD-10-CM | POA: Diagnosis not present

## 2021-01-07 DIAGNOSIS — F419 Anxiety disorder, unspecified: Secondary | ICD-10-CM | POA: Diagnosis not present

## 2021-01-07 DIAGNOSIS — Z23 Encounter for immunization: Secondary | ICD-10-CM | POA: Diagnosis not present

## 2021-01-07 DIAGNOSIS — H538 Other visual disturbances: Secondary | ICD-10-CM | POA: Diagnosis not present

## 2021-01-07 DIAGNOSIS — G4452 New daily persistent headache (NDPH): Secondary | ICD-10-CM | POA: Diagnosis not present

## 2021-01-20 DIAGNOSIS — R519 Headache, unspecified: Secondary | ICD-10-CM | POA: Diagnosis not present

## 2021-03-05 DIAGNOSIS — G44209 Tension-type headache, unspecified, not intractable: Secondary | ICD-10-CM | POA: Diagnosis not present

## 2021-03-05 DIAGNOSIS — Z23 Encounter for immunization: Secondary | ICD-10-CM | POA: Diagnosis not present

## 2021-03-05 DIAGNOSIS — G4700 Insomnia, unspecified: Secondary | ICD-10-CM | POA: Diagnosis not present

## 2021-07-22 DIAGNOSIS — L723 Sebaceous cyst: Secondary | ICD-10-CM | POA: Diagnosis not present

## 2021-10-13 DIAGNOSIS — F419 Anxiety disorder, unspecified: Secondary | ICD-10-CM | POA: Diagnosis not present

## 2021-10-13 DIAGNOSIS — Z6828 Body mass index (BMI) 28.0-28.9, adult: Secondary | ICD-10-CM | POA: Diagnosis not present

## 2021-10-13 DIAGNOSIS — F321 Major depressive disorder, single episode, moderate: Secondary | ICD-10-CM | POA: Diagnosis not present

## 2021-10-13 DIAGNOSIS — Z6829 Body mass index (BMI) 29.0-29.9, adult: Secondary | ICD-10-CM | POA: Insufficient documentation

## 2021-10-13 DIAGNOSIS — Z6831 Body mass index (BMI) 31.0-31.9, adult: Secondary | ICD-10-CM | POA: Insufficient documentation

## 2021-10-13 DIAGNOSIS — Z0000 Encounter for general adult medical examination without abnormal findings: Secondary | ICD-10-CM | POA: Diagnosis not present

## 2021-10-13 DIAGNOSIS — R202 Paresthesia of skin: Secondary | ICD-10-CM | POA: Diagnosis not present

## 2021-11-08 ENCOUNTER — Ambulatory Visit (HOSPITAL_COMMUNITY)
Admission: RE | Admit: 2021-11-08 | Discharge: 2021-11-08 | Disposition: A | Discharge status: Home / Self Care | Payer: BC Managed Care – PPO | Source: Ambulatory Visit | Attending: Physician Assistant | Admitting: Physician Assistant

## 2021-11-08 ENCOUNTER — Encounter (HOSPITAL_COMMUNITY): Payer: Self-pay

## 2021-11-08 VITALS — BP 120/87 | HR 84 | Temp 98.4°F | Resp 18

## 2021-11-08 DIAGNOSIS — Z20822 Contact with and (suspected) exposure to covid-19: Secondary | ICD-10-CM | POA: Diagnosis not present

## 2021-11-08 DIAGNOSIS — J069 Acute upper respiratory infection, unspecified: Secondary | ICD-10-CM

## 2021-11-08 DIAGNOSIS — J3489 Other specified disorders of nose and nasal sinuses: Secondary | ICD-10-CM | POA: Diagnosis not present

## 2021-11-08 DIAGNOSIS — R0981 Nasal congestion: Secondary | ICD-10-CM | POA: Diagnosis not present

## 2021-11-08 MED ORDER — FLUTICASONE PROPIONATE 50 MCG/ACT NA SUSP: 1.0000 | Freq: Every day | NASAL | 0 refills | Status: AC

## 2021-11-08 MED ORDER — METHYLPREDNISOLONE ACETATE 80 MG/ML IJ SUSP
60.0000 mg | Freq: Once | INTRAMUSCULAR | Status: AC
  Administered 2021-11-08: 60 mg via INTRAMUSCULAR

## 2021-11-08 MED ORDER — METHYLPREDNISOLONE ACETATE 80 MG/ML IJ SUSP
INTRAMUSCULAR | Status: AC
  Filled 2021-11-08: qty 1

## 2021-11-08 NOTE — ED Triage Notes (Signed)
3 day h/o sinus pressure, runny nose, chills and upper back aches. Denies cough and sore throat. Has been taking mucinex and tylenol.

## 2021-11-08 NOTE — Discharge Instructions (Signed)
I believe you have a virus.  I do not see any reason to start an antibiotic.  I will contact you if your COVID test is positive.  Stay out of work until you receive these results.  Continue your over-the-counter medication including Flonase, Mucinex, Tylenol.  Make sure you rest and drink plenty fluid.  We gave you a shot of steroids to help with your symptoms.  If your symptoms do not improve within 1 week please return for reevaluation.  If anything worsens and you have high fever, worsening cough, chest pain, shortness of breath, nausea/vomiting interfering with oral intake you need to be seen for reevaluation.

## 2021-11-08 NOTE — ED Provider Notes (Signed)
[place]    CSN: [id number] Arrival date & time: 11/08/21  1423      History   Chief Complaint Chief Complaint  Patient presents with   230appt   sinus pressure    HPI Kiara Klein is a 36 y.o. female.   Patient presents today with a 3-day history of URI symptoms including sinus pressure, runny nose, scratchy throat, body aches.  Denies any significant cough, sore throat, chest pain, shortness of breath, nausea, vomiting.  She does report household sick contacts with illness.  She denies asthma, allergies, COPD.  She does not smoke.  She has tried Tylenol, Mucinex, hot tea with only temporary relief of symptoms.  Denies any recent antibiotic or steroid use.  Denies history of diabetes.  She is confident that she is not pregnant.  She has had COVID in December 2021.  She is up-to-date on COVID vaccines including booster.   Past Medical History:  Diagnosis Date   Anxiety    Depression    Dizziness    HTN (hypertension)     Patient Active Problem List   Diagnosis Date Noted   Paresthesia 07/03/2020   Dizziness 07/03/2020   Depression, major, single episode, moderate ([place]) 03/25/2020   Anxiety 03/25/2020   Tibialis tendinitis of right lower extremity 10/11/2016    History reviewed. No pertinent surgical history.  OB History   No obstetric history on file.      Home Medications    Prior to Admission medications   Medication Sig Start Date End Date Taking? Authorizing Provider  fluticasone (FLONASE) 50 MCG/ACT nasal spray Place 1 spray into both nostrils daily. 11/08/21  Yes Grier Vu K, PA-C  busPIRone (BUSPAR) 15 MG tablet Take by mouth. 10/13/21   [provider]  escitalopram (LEXAPRO) 10 MG tablet Take 1 tablet (10 mg total) by mouth daily. appt required for future refills 07/21/20   Just, Laurita Quint, FNP  hydrochlorothiazide (HYDRODIURIL) 25 MG tablet Take 1 tablet (25 mg total) by mouth daily. 04/17/20   Just, Laurita Quint, FNP   meclizine (ANTIVERT) 25 MG tablet Take 1 tablet (25 mg total) by mouth 3 (three) times daily as needed for dizziness. 04/17/20   Just, Laurita Quint, FNP    Family History Family History  Problem Relation Age of Onset   Diabetes Mother    Heart disease Mother    Hypertension Mother    Prostate cancer Father    Hypertension Brother     Social History Social History   Tobacco Use   Smoking status: Never   Smokeless tobacco: Never  Vaping Use   Vaping Use: Never used  Substance Use Topics   Alcohol use: Yes    Alcohol/week: 0.0 standard drinks    Comment: occ   Drug use: No     Allergies   Bactrim [sulfamethoxazole-trimethoprim] and Penicillins   Review of Systems Review of Systems  Constitutional:  Positive for activity change and fatigue. Negative for appetite change and fever.  HENT:  Positive for congestion and sinus pressure. Negative for sneezing and sore throat.   Respiratory:  Negative for cough and shortness of breath.   Cardiovascular:  Negative for chest pain.  Gastrointestinal:  Negative for abdominal pain, diarrhea, nausea and vomiting.  Musculoskeletal:  Positive for arthralgias and myalgias.  Neurological:  Positive for headaches. Negative for dizziness and light-headedness.    Physical Exam Triage Vital Signs ED Triage Vitals  Enc Vitals Group     BP 11/08/21 1447  120/87     Pulse Rate 11/08/21 1447 84     Resp 11/08/21 1447 18     Temp 11/08/21 1447 98.4 F (36.9 C)     Temp Source 11/08/21 1447 Oral     SpO2 11/08/21 1447 98 %     Weight --      Height --      Head Circumference --      Peak Flow --      Pain Score 11/08/21 1446 5     Pain Loc --      Pain Edu? --      Excl. in [place]? --    No data found.  Updated Vital Signs BP 120/87 (BP Location: Right Arm)   Pulse 84   Temp 98.4 F (36.9 C) (Oral)   Resp 18   SpO2 98%   Visual Acuity Right Eye Distance:   Left Eye Distance:   Bilateral Distance:    Right Eye Near:   Left Eye  Near:    Bilateral Near:     Physical Exam Vitals reviewed.  Constitutional:      General: She is awake. She is not in acute distress.    Appearance: Normal appearance. She is well-developed. She is not ill-appearing.     Comments: Very pleasant female appears stated age in no acute distress sitting comfortably in exam room  HENT:     Head: Normocephalic and atraumatic.     Right Ear: Tympanic membrane, ear canal and external ear normal. Tympanic membrane is not erythematous or bulging.     Left Ear: Tympanic membrane, ear canal and external ear normal. Tympanic membrane is not erythematous or bulging.     Nose:     Right Sinus: Maxillary sinus tenderness and frontal sinus tenderness present.     Left Sinus: Maxillary sinus tenderness and frontal sinus tenderness present.     Mouth/Throat:     Pharynx: Uvula midline. Posterior oropharyngeal erythema present. No oropharyngeal exudate.     Comments: Significant erythema and drainage in posterior oropharynx Cardiovascular:     Rate and Rhythm: Normal rate and regular rhythm.     Heart sounds: Normal heart sounds, S1 normal and S2 normal. No murmur heard. Pulmonary:     Effort: Pulmonary effort is normal.     Breath sounds: Normal breath sounds. No wheezing, rhonchi or rales.     Comments: Clear to auscultation bilaterally Psychiatric:        Behavior: Behavior is cooperative.     UC Treatments / Results  Labs (all labs ordered are listed, but only abnormal results are displayed) Labs Reviewed  SARS CORONAVIRUS 2 (TAT 6-24 HRS)    EKG   Radiology No results found.  Procedures Procedures (including critical care time)  Medications Ordered in UC Medications  methylPREDNISolone acetate (DEPO-MEDROL) injection 60 mg (has no administration in time range)    Initial Impression / Assessment and Plan / UC Course  I have reviewed the triage vital signs and the nursing notes.  Pertinent labs & imaging results that were  available during my care of the patient were reviewed by me and considered in my medical decision making (see chart for details).     Patient is well-appearing, afebrile, nontoxic, nontachycardic.  Discussed that symptoms are likely viral in nature given short duration.  No evidence of acute infection on physical exam that would warrant initiation of antibiotics.  COVID testing was obtained today-results pending.  She was instructed to remain in  isolation until results are available and was given work excuse note with current CDC return to work guidelines based on COVID test result.  Recommended she continue conservative treatment measures including Mucinex, Flonase, Tylenol for symptom relief.  She was given injection of Depo-Medrol to help manage sinus pressure/inflammation.  Discussed that she should rest and drink plenty of fluid.  If she has any worsening symptoms she is to return for reevaluation.  Strict return precautions given to which she expressed understanding.  Final Clinical Impressions(s) / UC Diagnoses   Final diagnoses:  Upper respiratory tract infection, unspecified type  Sinus pressure  Nasal congestion     Discharge Instructions      I believe you have a virus.  I do not see any reason to start an antibiotic.  I will contact you if your COVID test is positive.  Stay out of work until you receive these results.  Continue your over-the-counter medication including Flonase, Mucinex, Tylenol.  Make sure you rest and drink plenty fluid.  We gave you a shot of steroids to help with your symptoms.  If your symptoms do not improve within 1 week please return for reevaluation.  If anything worsens and you have high fever, worsening cough, chest pain, shortness of breath, nausea/vomiting interfering with oral intake you need to be seen for reevaluation.     ED Prescriptions     Medication Sig Dispense Auth. Provider   fluticasone (FLONASE) 50 MCG/ACT nasal spray Place 1 spray into  both nostrils daily. 16 g Nazaria Riesen K, PA-C      PDMP not reviewed this encounter.   Terrilee Croak, PA-C 11/08/21 1514

## 2021-11-09 LAB — SARS CORONAVIRUS 2 (TAT 6-24 HRS): SARS Coronavirus 2: NEGATIVE

## 2021-11-18 DIAGNOSIS — F419 Anxiety disorder, unspecified: Secondary | ICD-10-CM | POA: Diagnosis not present

## 2021-11-18 DIAGNOSIS — F411 Generalized anxiety disorder: Secondary | ICD-10-CM | POA: Diagnosis not present

## 2022-01-13 DIAGNOSIS — F411 Generalized anxiety disorder: Secondary | ICD-10-CM | POA: Diagnosis not present

## 2022-01-27 IMAGING — CR DG CHEST 2V
2 series · 2 of 2 positions shown · non-contrast
Comparison: Chest radiographs 07/05/2019 and earlier.

CLINICAL DATA: 34-year-old female with intermittent chest pain,
dizziness, weakness.

EXAM:
CHEST - 2 VIEW

[w chest pa]
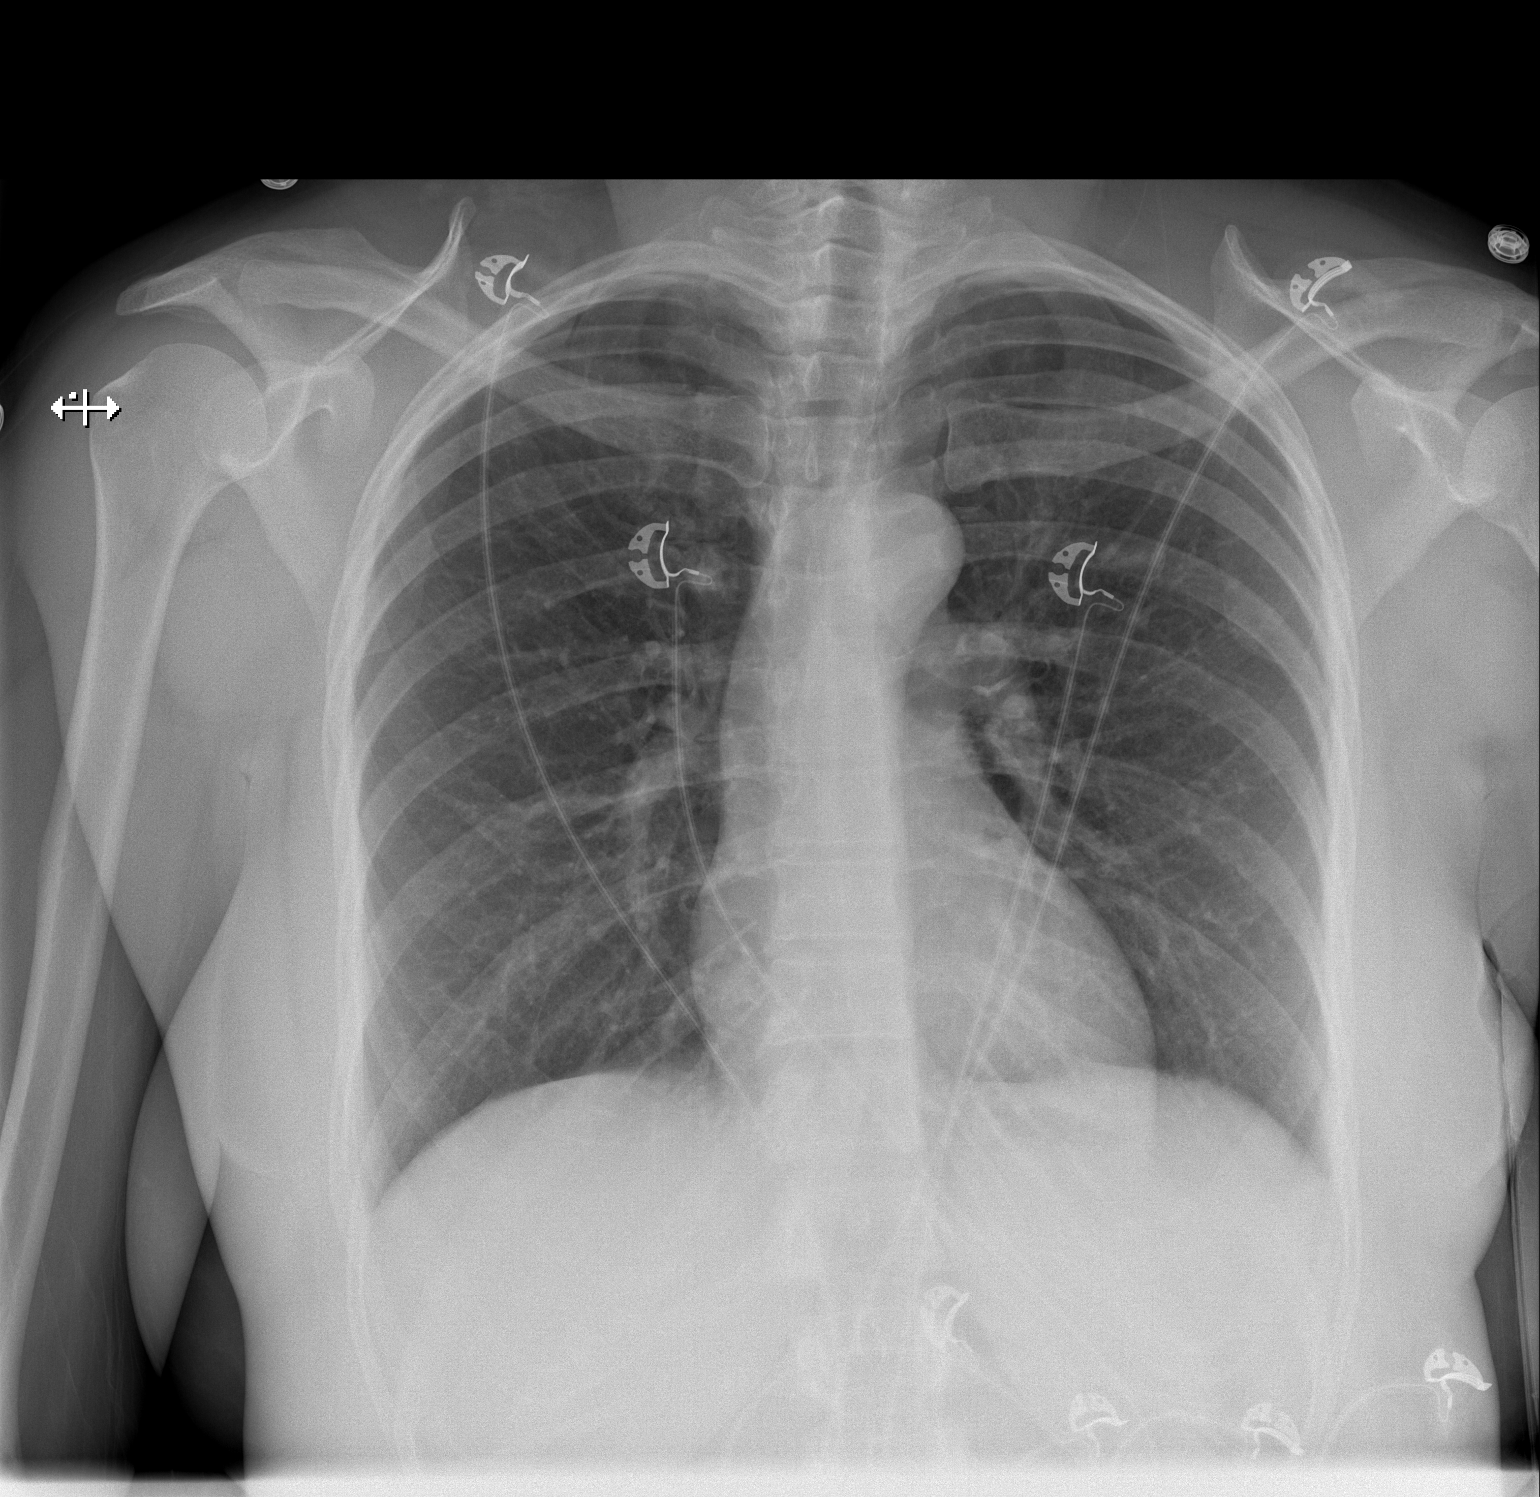

[w chest lat]
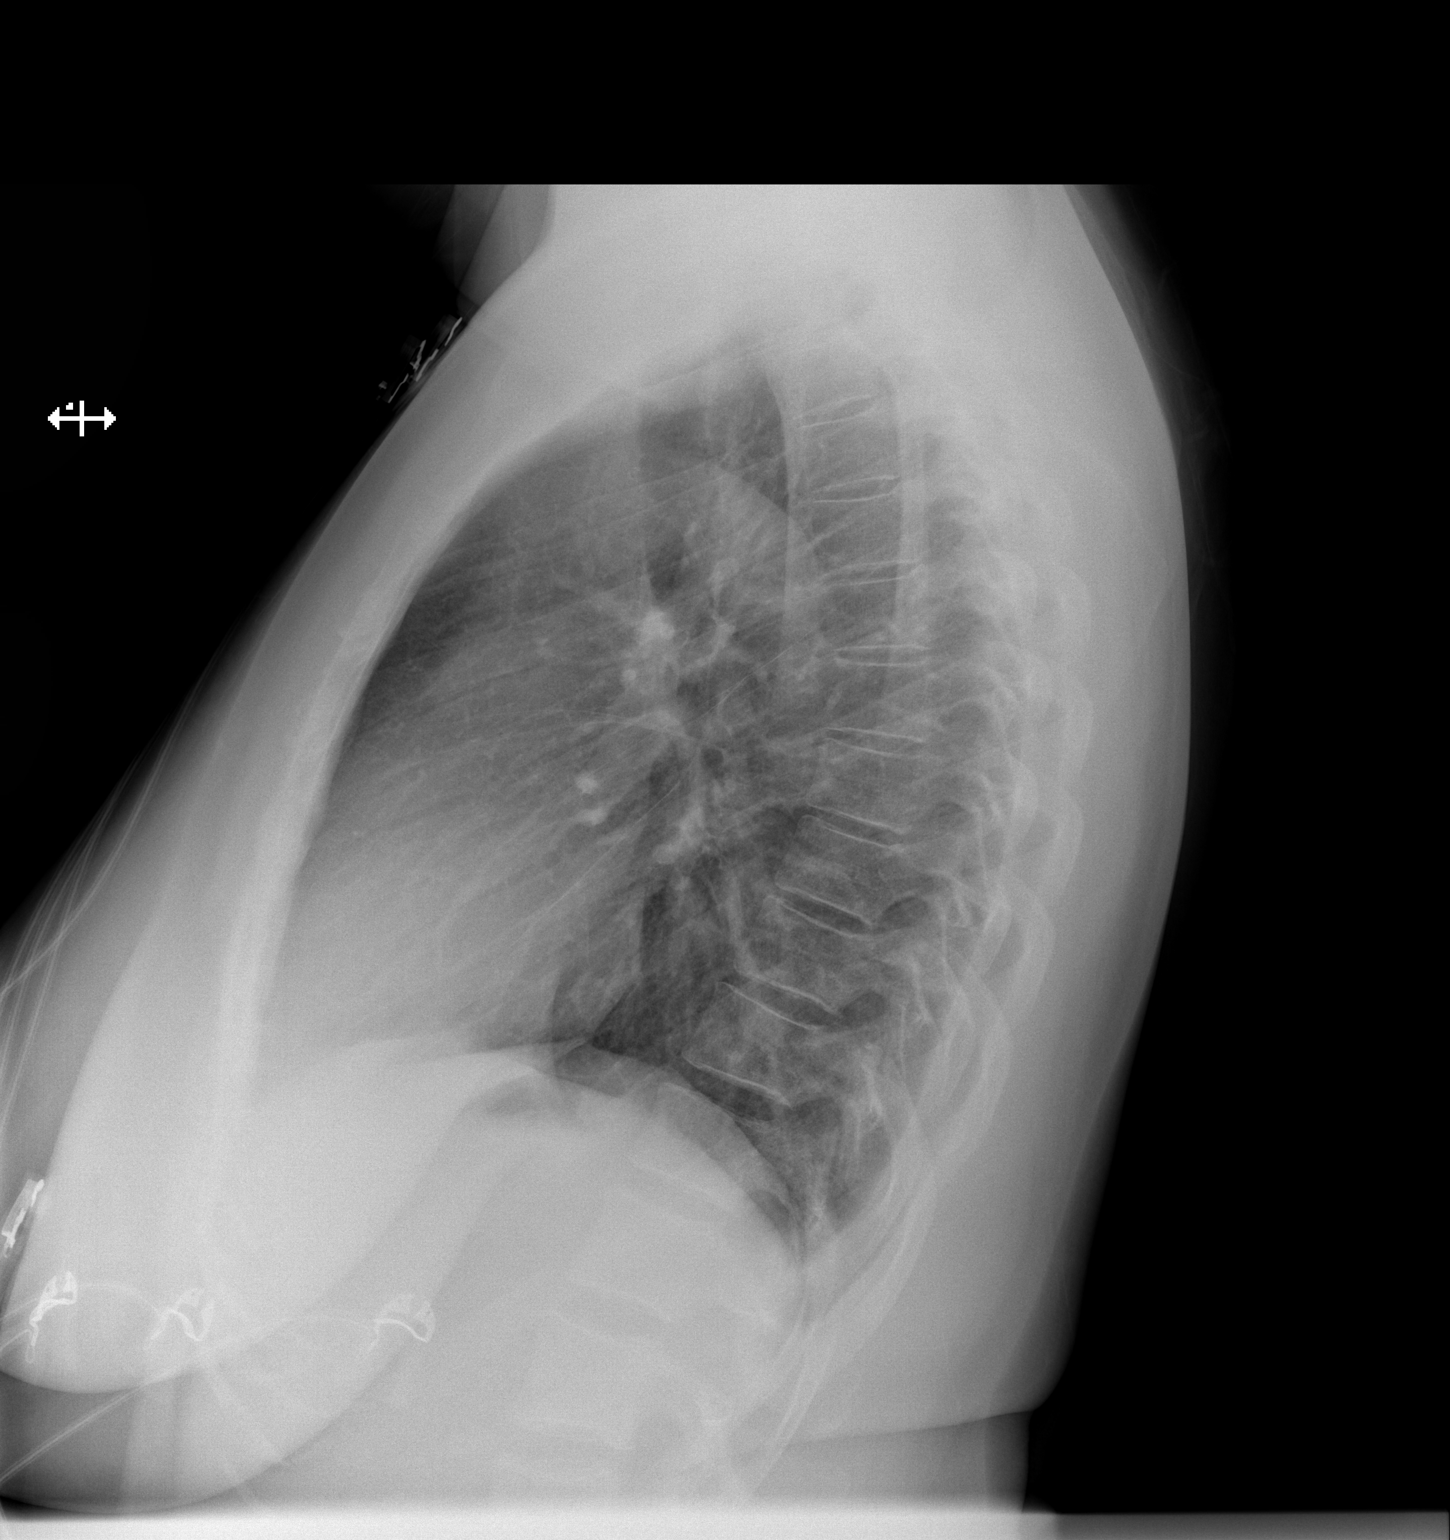

[2 of 2 positions shown; findings below may reference images not displayed]

FINDINGS: Lung volumes and mediastinal contours remain normal. The lungs are
clear. Visualized tracheal air column is within normal limits. No
pneumothorax or pleural effusion.

No osseous abnormality identified.  Paucity of bowel gas.
IMPRESSION: Negative.  No cardiopulmonary abnormality.

## 2022-02-15 DIAGNOSIS — F411 Generalized anxiety disorder: Secondary | ICD-10-CM | POA: Diagnosis not present

## 2022-03-01 DIAGNOSIS — F411 Generalized anxiety disorder: Secondary | ICD-10-CM | POA: Diagnosis not present

## 2022-03-03 DIAGNOSIS — U071 COVID-19: Secondary | ICD-10-CM | POA: Insufficient documentation

## 2022-03-15 DIAGNOSIS — F411 Generalized anxiety disorder: Secondary | ICD-10-CM | POA: Diagnosis not present

## 2022-04-01 DIAGNOSIS — F411 Generalized anxiety disorder: Secondary | ICD-10-CM | POA: Diagnosis not present

## 2022-04-26 DIAGNOSIS — F411 Generalized anxiety disorder: Secondary | ICD-10-CM | POA: Diagnosis not present

## 2022-05-17 DIAGNOSIS — F411 Generalized anxiety disorder: Secondary | ICD-10-CM | POA: Diagnosis not present

## 2022-05-31 DIAGNOSIS — F411 Generalized anxiety disorder: Secondary | ICD-10-CM | POA: Diagnosis not present

## 2022-06-15 DIAGNOSIS — J069 Acute upper respiratory infection, unspecified: Secondary | ICD-10-CM | POA: Diagnosis not present

## 2022-06-24 DIAGNOSIS — N898 Other specified noninflammatory disorders of vagina: Secondary | ICD-10-CM | POA: Diagnosis not present

## 2022-06-24 DIAGNOSIS — E049 Nontoxic goiter, unspecified: Secondary | ICD-10-CM | POA: Diagnosis not present

## 2022-06-24 DIAGNOSIS — Z01419 Encounter for gynecological examination (general) (routine) without abnormal findings: Secondary | ICD-10-CM | POA: Diagnosis not present

## 2022-06-24 DIAGNOSIS — I10 Essential (primary) hypertension: Secondary | ICD-10-CM | POA: Diagnosis not present

## 2022-06-24 DIAGNOSIS — Z1159 Encounter for screening for other viral diseases: Secondary | ICD-10-CM | POA: Diagnosis not present

## 2022-06-25 ENCOUNTER — Other Ambulatory Visit: Payer: Self-pay | Admitting: Obstetrics and Gynecology

## 2022-06-25 DIAGNOSIS — E049 Nontoxic goiter, unspecified: Secondary | ICD-10-CM

## 2022-07-01 ENCOUNTER — Ambulatory Visit
Admission: RE | Admit: 2022-07-01 | Discharge: 2022-07-01 | Disposition: A | Discharge status: Home / Self Care | Payer: BC Managed Care – PPO | Source: Ambulatory Visit | Attending: Obstetrics and Gynecology | Admitting: Obstetrics and Gynecology

## 2022-07-01 DIAGNOSIS — E049 Nontoxic goiter, unspecified: Secondary | ICD-10-CM

## 2022-07-01 DIAGNOSIS — E010 Iodine-deficiency related diffuse (endemic) goiter: Secondary | ICD-10-CM | POA: Diagnosis not present

## 2022-07-02 DIAGNOSIS — R922 Inconclusive mammogram: Secondary | ICD-10-CM | POA: Diagnosis not present

## 2022-07-02 DIAGNOSIS — N644 Mastodynia: Secondary | ICD-10-CM | POA: Diagnosis not present

## 2022-07-07 DIAGNOSIS — F411 Generalized anxiety disorder: Secondary | ICD-10-CM | POA: Diagnosis not present

## 2022-07-19 DIAGNOSIS — F411 Generalized anxiety disorder: Secondary | ICD-10-CM | POA: Diagnosis not present

## 2022-07-27 ENCOUNTER — Ambulatory Visit: Payer: BC Managed Care – PPO | Attending: Internal Medicine | Admitting: Internal Medicine

## 2022-07-27 ENCOUNTER — Encounter: Payer: Self-pay | Admitting: Internal Medicine

## 2022-07-27 VITALS — BP 132/78 | HR 95 | Ht 65.0 in | Wt 202.4 lb

## 2022-07-27 DIAGNOSIS — I10 Essential (primary) hypertension: Secondary | ICD-10-CM

## 2022-07-27 NOTE — Patient Instructions (Signed)
Medication Instructions:  No Changes In Medications at this time.  *If you need a refill on your cardiac medications before your next appointment, please call your pharmacy*  Lab Work: No Changes In Medications at this time.  If you have labs (blood work) drawn today and your tests are completely normal, you will receive your results only by: [facility] (if you have MyChart) OR A paper copy in the mail If you have any lab test that is abnormal or we need to change your treatment, we will call you to review the results.  Testing/Procedures: None Ordered At This Time.   Follow-Up: At [facility], you and your health needs are our priority.  As part of our continuing mission to provide you with exceptional heart care, we have created designated Provider Care Teams.  These Care Teams include your primary Cardiologist (physician) and Advanced Practice Providers (APPs -  Physician Assistants and Nurse Practitioners) who all work together to provide you with the care you need, when you need it.  Your next appointment:   1 year(s)  Provider:   Janina Mayo, MD   .Juliet Rude

## 2022-07-27 NOTE — Progress Notes (Signed)
Cardiology Office Note:    Date:  07/29/2022   ID:  Kiara Klein, DOB [birth year], MRN [id number]  PCP:  Bernerd Limbo, MD   [facility] Cardiologist:  Janina Mayo, MD     Referring MD: Servando Salina, MD   No chief complaint on file. HTN  History of Present Illness:    Kiara Klein is a 37 y.o. female with a hx of anxiety, referral for HTN, she was planned for HTN clinic.    She started taking Hctz 25 mg for four years. She has had one pregnancy at 25 , no BP issues. Her diasotlic BP has been high in the past 90s/100. Systolic [id number]. She has a BP cuff. On her medication she gets 120/70 or 120/80 mmHg. No hospitalizations  No exercise. No CP or DOE. Has anxiety and gets arm numbness/tingling. No palpitations. No smoking  Mother is deceased. She had HTN. Hx of MI, 3vCABG; age 18. Maternal Uncle MI late 40s. He was 58.   Father deceased due to heart failure, 67 years old.  Past Medical History:  Diagnosis Date   Anxiety    Depression    Dizziness    HTN (hypertension)      Current Medications: Current Outpatient Medications on File Prior to Visit  Medication Sig Dispense Refill   busPIRone (BUSPAR) 15 MG tablet Take by mouth.     escitalopram (LEXAPRO) 10 MG tablet Take 1 tablet (10 mg total) by mouth daily. appt required for future refills 90 tablet 0   fluticasone (FLONASE) 50 MCG/ACT nasal spray Place 1 spray into both nostrils daily. 16 g 0   hydrochlorothiazide (HYDRODIURIL) 25 MG tablet Take 1 tablet (25 mg total) by mouth daily. 90 tablet 3   meclizine (ANTIVERT) 25 MG tablet Take 1 tablet (25 mg total) by mouth 3 (three) times daily as needed for dizziness. 30 tablet 0   No current facility-administered medications on file prior to visit.     Allergies:   Bactrim [sulfamethoxazole-trimethoprim] and Penicillins   Social History   Socioeconomic History   Marital status: Single    Spouse name: Not on file   Number  of children: Not on file   Years of education: college   Highest education level: Associate degree: academic program  Occupational History   Occupation: patient coordinator/CMA/Med Tech  Tobacco Use   Smoking status: Never   Smokeless tobacco: Never  Vaping Use   Vaping Use: Never used  Substance and Sexual Activity   Alcohol use: Yes    Alcohol/week: 0.0 standard drinks of alcohol    Comment: occ   Drug use: No   Sexual activity: Yes    Birth control/protection: None  Other Topics Concern   Not on file  Social History Narrative   Lives at home with daughter and grandson.   Right-handed.   Caffeine use: coffee 2-3 times per week.   Social Determinants of Health   Financial Resource Strain: Not on file  Food Insecurity: Not on file  Transportation Needs: Not on file  Physical Activity: Not on file  Stress: Not on file  Social Connections: Not on file     Family History: The patient's family history includes Diabetes in her mother; Heart disease in her mother; Hypertension in her brother and mother; Prostate cancer in her father.  ROS:   Please see the history of present illness.     All other systems reviewed and are negative.  EKGs/Labs/Other Studies Reviewed:  The following studies were reviewed today:   EKG:  EKG is  ordered today.  The ekg ordered today demonstrates   07/27/2022- NSR  Recent Labs: No results found for requested labs within last 365 days.   Recent Lipid Panel    Component Value Date/Time   CHOL 187 04/17/2020 1032   TRIG 74 04/17/2020 1032   HDL 54 04/17/2020 1032   CHOLHDL 3.5 04/17/2020 1032   LDLCALC 119 (H) 04/17/2020 1032     Risk Assessment/Calculations:     Physical Exam:    VS:   Vitals:   07/27/22 1619  BP: 132/78  Pulse: 95  SpO2: 97%     Wt Readings from Last 3 Encounters:  07/27/22 202 lb 6.4 oz (91.8 kg)  07/03/20 183 lb 8 oz (83.2 kg)  04/22/20 180 lb (81.6 kg)     GEN:  Well nourished, well developed  in no acute distress HEENT: Normal NECK: No JVD; No carotid bruits LYMPHATICS: No lymphadenopathy CARDIAC: RRR, no murmurs, rubs, gallops RESPIRATORY:  Clear to auscultation without rales, wheezing or rhonchi  ABDOMEN: Soft, non-tender, non-distended MUSCULOSKELETAL:  No edema; No deformity  SKIN: Warm and dry NEUROLOGIC:  Alert and oriented x 3 PSYCHIATRIC:  Normal affect   ASSESSMENT:    HTN: well controlled. She is currently on Hctz 25 mg daily. Will continue.  PLAN:    In order of problems listed above:  Follow up in 12 months       Medication Adjustments/Labs and Tests Ordered: Current medicines are reviewed at length with the patient today.  Concerns regarding medicines are outlined above.  Orders Placed This Encounter  Procedures   EKG 12-Lead   No orders of the defined types were placed in this encounter.   Patient Instructions  Medication Instructions:  No Changes In Medications at this time.  *If you need a refill on your cardiac medications before your next appointment, please call your pharmacy*  Lab Work: No Changes In Medications at this time.  If you have labs (blood work) drawn today and your tests are completely normal, you will receive your results only by: [place] (if you have MyChart) OR A paper copy in the mail If you have any lab test that is abnormal or we need to change your treatment, we will call you to review the results.  Testing/Procedures: None Ordered At This Time.   Follow-Up: At [facility], you and your health needs are our priority.  As part of our continuing mission to provide you with exceptional heart care, we have created designated Provider Care Teams.  These Care Teams include your primary Cardiologist (physician) and Advanced Practice Providers (APPs -  Physician Assistants and Nurse Practitioners) who all work together to provide you with the care you need, when you need it.  Your next appointment:   1  year(s)  Provider:   Janina Mayo, MD   .Aubery Lapping, MD  07/29/2022 7:53 PM    [place]

## 2022-08-02 DIAGNOSIS — F411 Generalized anxiety disorder: Secondary | ICD-10-CM | POA: Diagnosis not present

## 2022-08-30 DIAGNOSIS — F411 Generalized anxiety disorder: Secondary | ICD-10-CM | POA: Diagnosis not present

## 2022-09-23 DIAGNOSIS — M25561 Pain in right knee: Secondary | ICD-10-CM | POA: Diagnosis not present

## 2022-09-27 DIAGNOSIS — F411 Generalized anxiety disorder: Secondary | ICD-10-CM | POA: Diagnosis not present

## 2022-10-29 IMAGING — CT CT HEAD W/O CM
3 series · 14 of 46 positions shown, 16 images · non-contrast
Comparison: None.

CLINICAL DATA: Trigeminal headache.

EXAM:
CT HEAD WITHOUT CONTRAST
TECHNIQUE: Contiguous axial images were obtained from the base of the skull
through the vertex without intravenous contrast.

[Series 2: head wo · axial · 0.49mm/px · z∈[+1387,+1507]mm · 8 of 29 slices shown, 10 images]
[im 3/29  brain]
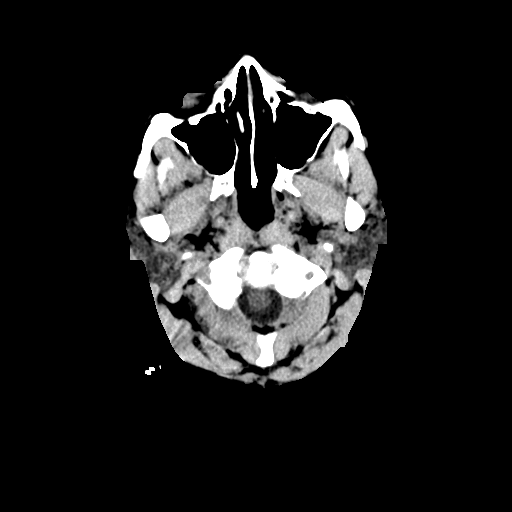
[im 3/29  bone]
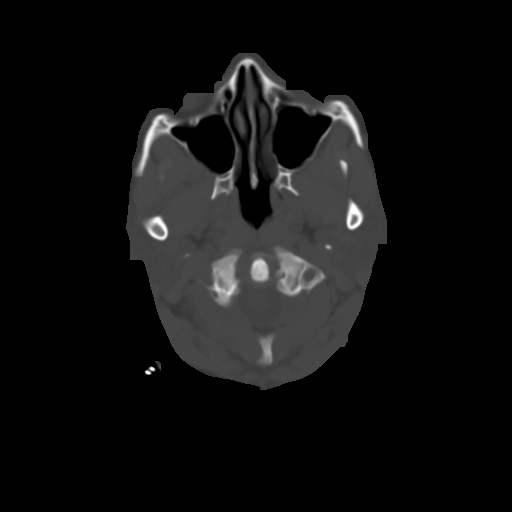
[im 7/29  brain]
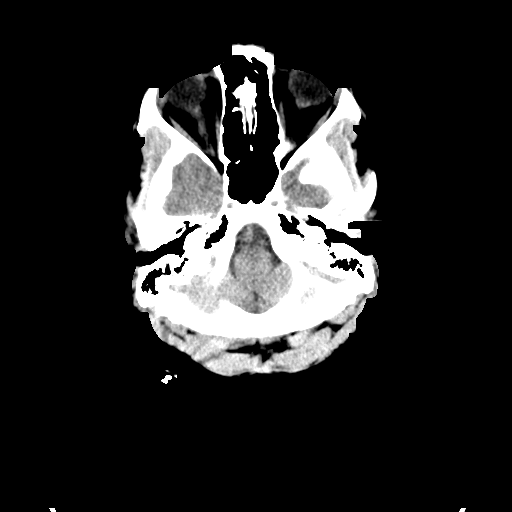
[im 10/29  brain]
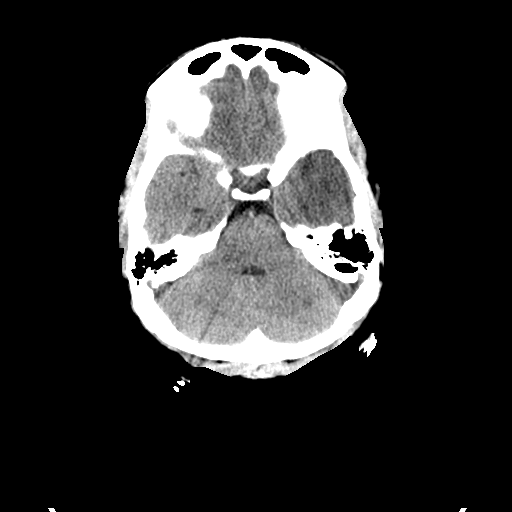
[im 13/29  brain]
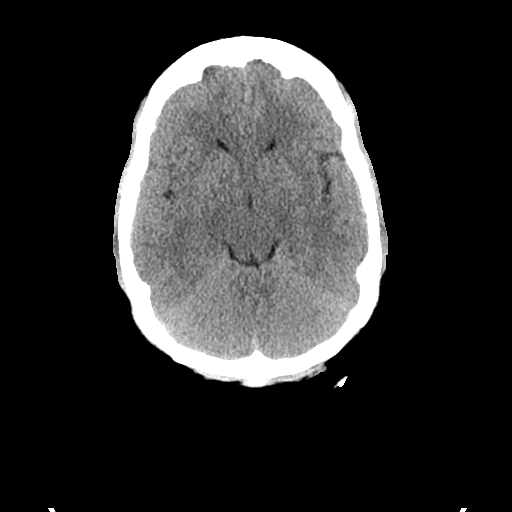
[im 17/29  brain]
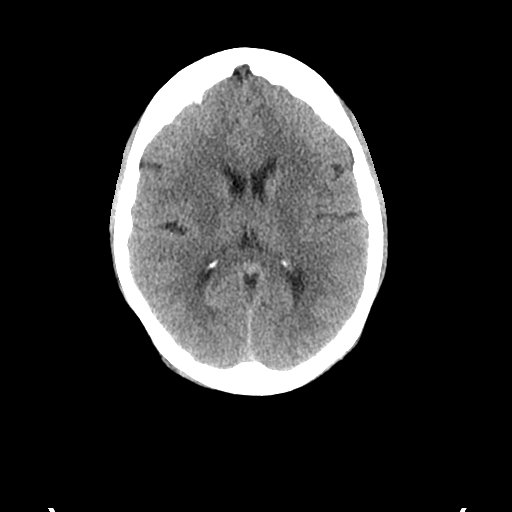
[im 17/29  bone]
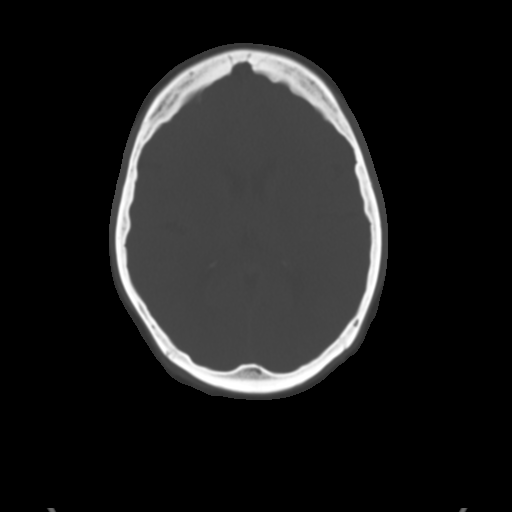
[im 20/29  brain]
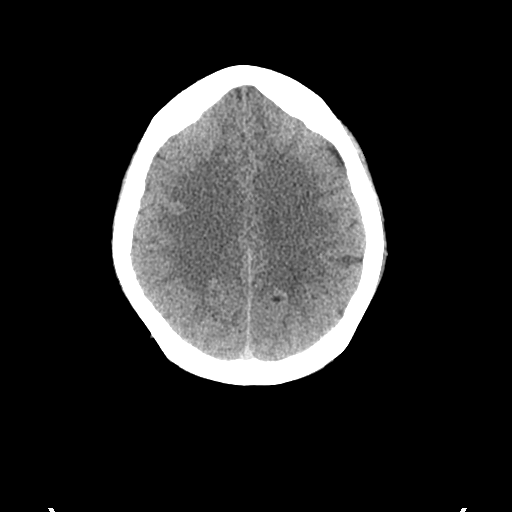
[im 23/29  brain]
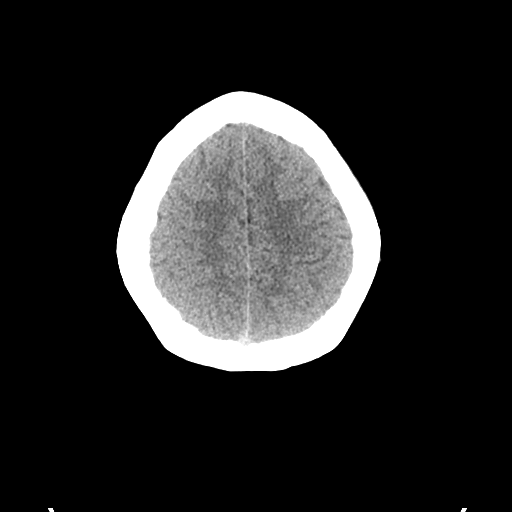
[im 27/29  brain]
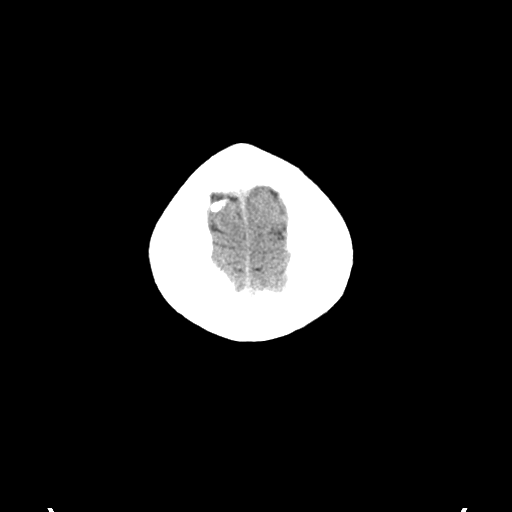

[Series 5: coronal soft tissue · coronal · 0.34mm/px · 3 of 67 slices shown]
[im 23/67  brain]
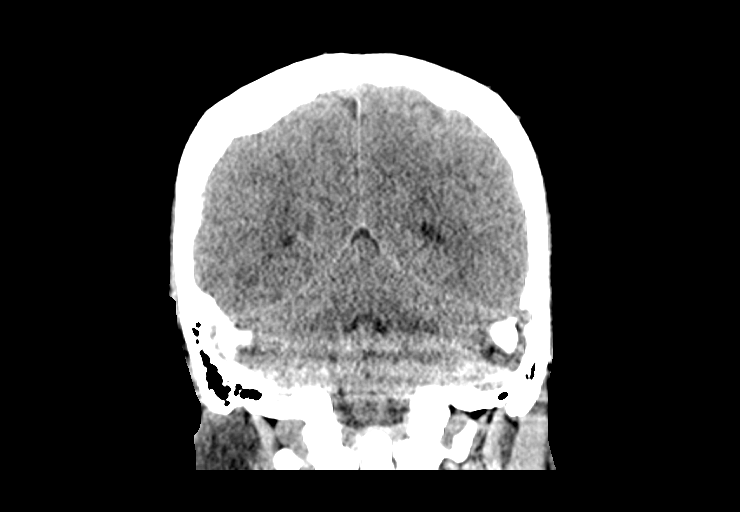
[im 30/67  brain]
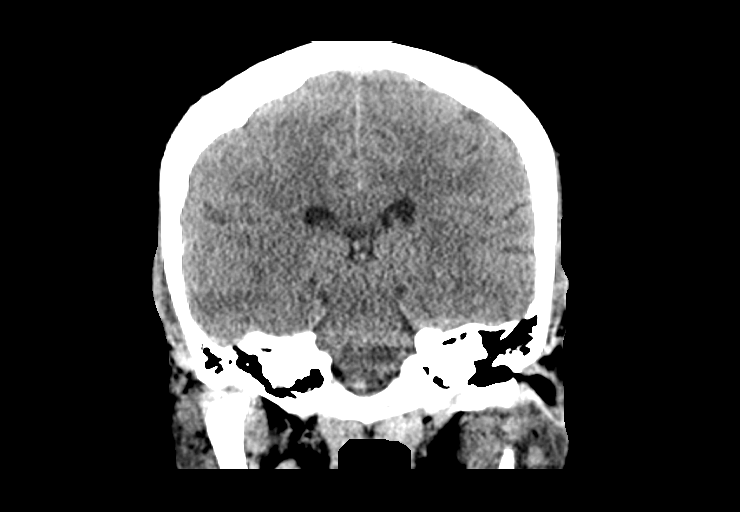
[im 37/67  brain]
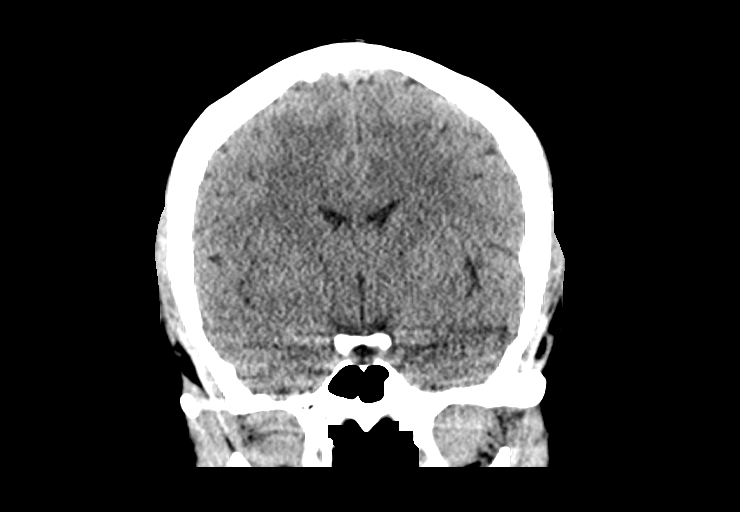

[Series 6: sagittal soft tissue · sagittal · 0.34mm/px · 3 of 56 slices shown]
[im 19/56  brain]
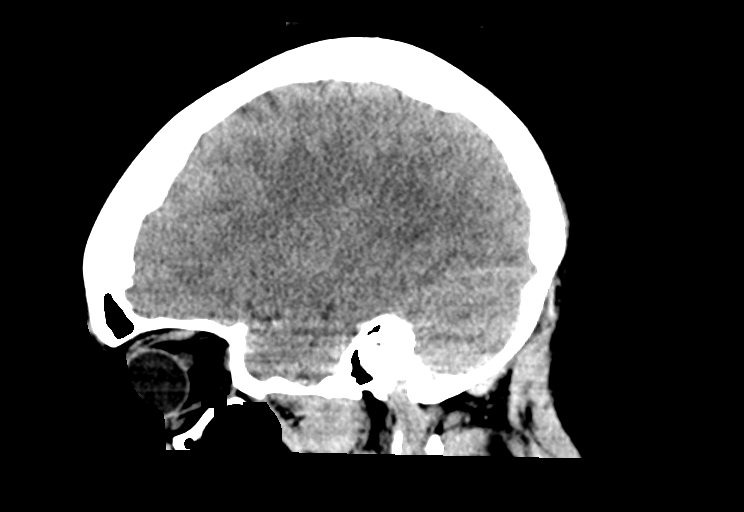
[im 28/56  brain]
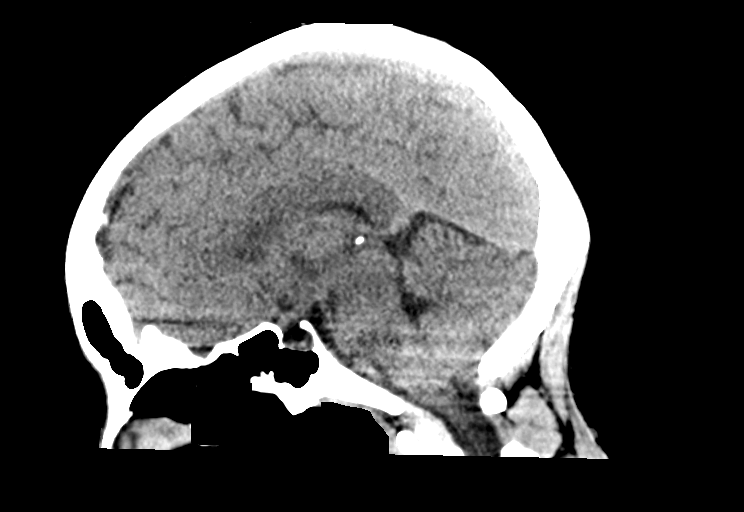
[im 37/56  brain]
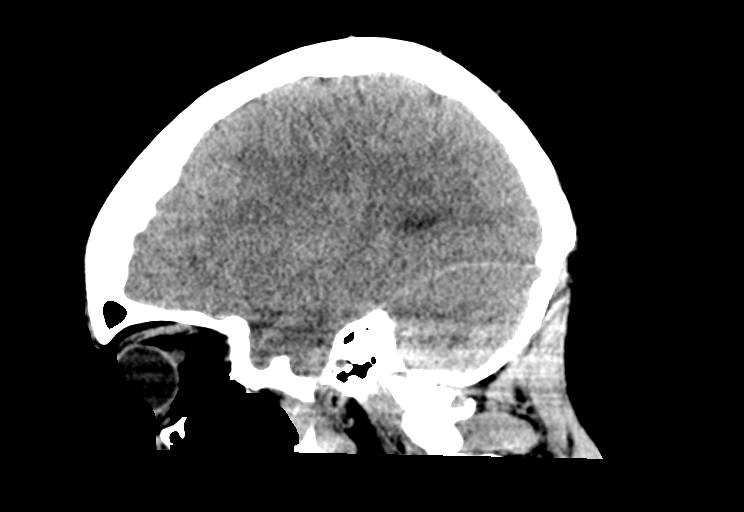

[14 of 46 positions shown; findings below may reference images not displayed]

FINDINGS: Brain: No evidence of acute infarction, hemorrhage, hydrocephalus,
extra-axial collection or mass lesion/mass effect.

Vascular: No hyperdense vessel or unexpected calcification.

Skull: Normal. Negative for fracture or focal lesion.

Sinuses/Orbits: No acute finding.

Other: None.
IMPRESSION: No acute intracranial abnormality.

## 2022-11-11 DIAGNOSIS — M47812 Spondylosis without myelopathy or radiculopathy, cervical region: Secondary | ICD-10-CM | POA: Diagnosis not present

## 2022-11-11 DIAGNOSIS — M542 Cervicalgia: Secondary | ICD-10-CM | POA: Diagnosis not present

## 2023-04-17 ENCOUNTER — Emergency Department (HOSPITAL_BASED_OUTPATIENT_CLINIC_OR_DEPARTMENT_OTHER)
Admission: EM | Admit: 2023-04-17 | Discharge: 2023-04-17 | Disposition: A | Discharge status: Home / Self Care | Payer: BC Managed Care – PPO

## 2023-04-17 ENCOUNTER — Other Ambulatory Visit: Payer: Self-pay

## 2023-04-17 ENCOUNTER — Encounter (HOSPITAL_BASED_OUTPATIENT_CLINIC_OR_DEPARTMENT_OTHER): Payer: Self-pay

## 2023-04-17 DIAGNOSIS — L02412 Cutaneous abscess of left axilla: Secondary | ICD-10-CM | POA: Diagnosis not present

## 2023-04-17 DIAGNOSIS — Z79899 Other long term (current) drug therapy: Secondary | ICD-10-CM | POA: Insufficient documentation

## 2023-04-17 DIAGNOSIS — L0291 Cutaneous abscess, unspecified: Secondary | ICD-10-CM

## 2023-04-17 DIAGNOSIS — I10 Essential (primary) hypertension: Secondary | ICD-10-CM | POA: Diagnosis not present

## 2023-04-17 MED ORDER — CLINDAMYCIN HCL 150 MG PO CAPS
300.0000 mg | ORAL_CAPSULE | Freq: Once | ORAL | Status: DC
Start: 2023-04-17 — End: 2023-04-17

## 2023-04-17 MED ORDER — DOXYCYCLINE HYCLATE 100 MG PO TABS
100.0000 mg | ORAL_TABLET | Freq: Once | ORAL | Status: AC
Start: 2023-04-17 — End: 2023-04-17
  Administered 2023-04-17: 100 mg via ORAL
  Filled 2023-04-17: qty 1

## 2023-04-17 MED ORDER — DOXYCYCLINE HYCLATE 100 MG PO CAPS: 100.0000 mg | ORAL_CAPSULE | Freq: Two times a day (BID) | ORAL | 0 refills | Status: AC

## 2023-04-17 NOTE — ED Provider Notes (Signed)
EMERGENCY DEPARTMENT AT [facility] Provider Note   CSN: [id number] Arrival date & time: 04/17/23  1534     History  Chief Complaint  Patient presents with   Abscess    Kiara Klein is a 37 y.o. female with PMHx HTN and anxiety who presents to ED concerned for pain and swelling in left axillary area x1 week. Patient stating that these symptoms have happened before and she even had an I&D for an axillary abscess 10 years ago. Denies history of HS. Also had axillary swelling last year that did not require I&D.  Denies fever, chest pain, dyspnea, cough, nausea, vomiting, diarrhea.    Abscess      Home Medications Prior to Admission medications   Medication Sig Start Date End Date Taking? Authorizing Provider  doxycycline (VIBRAMYCIN) 100 MG capsule Take 1 capsule (100 mg total) by mouth 2 (two) times daily for 7 days. 04/17/23 04/24/23 Yes Kregg Cihlar, Charlotte Sanes F, PA-C  busPIRone (BUSPAR) 15 MG tablet Take by mouth. 10/13/21   [provider]  escitalopram (LEXAPRO) 10 MG tablet Take 1 tablet (10 mg total) by mouth daily. appt required for future refills 07/21/20   Just, Azalee Course, FNP  fluticasone (FLONASE) 50 MCG/ACT nasal spray Place 1 spray into both nostrils daily. 11/08/21   Raspet, Noberto Retort, PA-C  hydrochlorothiazide (HYDRODIURIL) 25 MG tablet Take 1 tablet (25 mg total) by mouth daily. 04/17/20   Just, Azalee Course, FNP  meclizine (ANTIVERT) 25 MG tablet Take 1 tablet (25 mg total) by mouth 3 (three) times daily as needed for dizziness. 04/17/20   Just, Azalee Course, FNP      Allergies    Bactrim [sulfamethoxazole-trimethoprim] and Penicillins    Review of Systems   Review of Systems  Musculoskeletal:        Swelling of left axilla    Physical Exam Updated Vital Signs BP (!) 143/99 (BP Location: Right Arm)   Pulse 83   Temp 98.1 F (36.7 C) (Oral)   Resp 18   Ht 5\' 7"  (1.702 m)   Wt 92.1 kg   LMP 04/07/2023 (Approximate)   SpO2 100%    BMI 31.79 kg/m  Physical Exam Vitals and nursing note reviewed.  Constitutional:      General: She is not in acute distress.    Appearance: She is not ill-appearing or toxic-appearing.  HENT:     Head: Normocephalic and atraumatic.  Eyes:     General: No scleral icterus.       Right eye: No discharge.        Left eye: No discharge.     Conjunctiva/sclera: Conjunctivae normal.  Cardiovascular:     Rate and Rhythm: Normal rate.  Pulmonary:     Effort: Pulmonary effort is normal.  Abdominal:     General: Abdomen is flat.  Skin:    General: Skin is warm and dry.     Comments: Small area of swelling and mild erythema of left axillary area. Some fluctuance. Was able to [place] area which revealed a 0.5cm superficial fluid collection. +2 radial pulse. Sensation to light touch intact of left arm.  Neurological:     General: No focal deficit present.     Mental Status: She is alert. Mental status is at baseline.  Psychiatric:        Mood and Affect: Mood normal.        Behavior: Behavior normal.     ED Results / Procedures / Treatments  Labs (all labs ordered are listed, but only abnormal results are displayed) Labs Reviewed - No data to display  EKG None  Radiology No results found.  Procedures Procedures    Medications Ordered in ED Medications  doxycycline (VIBRA-TABS) tablet 100 mg (100 mg Oral Given 04/17/23 1654)    ED Course/ Medical Decision Making/ A&P                                 Medical Decision Making Risk Prescription drug management.    This patient presents to the ED for concern of abscess, this involves an extensive number of treatment options, and is a complaint that carries with it a high risk of complications and morbidity.  The differential diagnosis includes cellulitis, abscess, sepsis, folliculitis, necrotizing fasciitis, impetigo   Co morbidities that complicate the patient evaluation  HTN   Additional history obtained:  Thomes Cake  PCP   Problem List / ED Course / Critical interventions / Medication management  Patient presents to ED concerned for left axillary abscess that has been developing over the past week.  denying any other infectious symptoms at this time.  Patient concerned because the area is now painful. I was able to image the area with bedside US which revealed a 0.5cm superficial fluid collection. Size is not appropriate for I&D today. Will proceed with oral antibiotics and have patient follow up with PCP.  Patient verbalized understanding of plan. Provided patient with a dose of doxycycline in ED which they tolerated well. sending rest of prescription to pharmacy. I have reviewed the patients home medicines and have made adjustments as needed Patient afebrile with stable vitals.  Provided with return precautions.  Discharged in good condition.   Social Determinants of Health:  none         Final Clinical Impression(s) / ED Diagnoses Final diagnoses:  Abscess    Rx / DC Orders ED Discharge Orders          Ordered    doxycycline (VIBRAMYCIN) 100 MG capsule  2 times daily        04/17/23 1701              Dorthy Cooler, [place] 04/17/23 1703    Coral Spikes, DO 04/17/23 2314

## 2023-04-17 NOTE — ED Triage Notes (Signed)
Pt POV from home c/o painful bump in left armpit x 1 week, came to ED due to redness and tenderness. States this has been recurring off and on since the beginning of the year.

## 2023-04-17 NOTE — Discharge Instructions (Addendum)
It was a pleasure caring for you today.  Your abscess was not large enough for an incision and drainage.  We will proceed with oral antibiotics and have you follow-up with your primary care provider.  Seek emergency care if experiencing any new or worsening symptoms.

## 2023-07-27 ENCOUNTER — Ambulatory Visit (HOSPITAL_COMMUNITY)
Admission: EM | Admit: 2023-07-27 | Discharge: 2023-07-27 | Disposition: A | Discharge status: Home / Self Care | Payer: BC Managed Care – PPO | Attending: Emergency Medicine | Admitting: Emergency Medicine

## 2023-07-27 ENCOUNTER — Encounter (HOSPITAL_COMMUNITY): Payer: Self-pay | Admitting: Emergency Medicine

## 2023-07-27 DIAGNOSIS — J069 Acute upper respiratory infection, unspecified: Secondary | ICD-10-CM

## 2023-07-27 LAB — POCT INFLUENZA A/B
Influenza A, POC: NEGATIVE
Influenza B, POC: NEGATIVE

## 2023-07-27 MED ORDER — BENZONATATE 100 MG PO CAPS: 100.0000 mg | ORAL_CAPSULE | Freq: Three times a day (TID) | ORAL | 0 refills | Status: AC

## 2023-07-27 NOTE — ED Triage Notes (Signed)
 Patient c/o body aches, chills, fatigue, headache, sore throat since last night.  Patient has taken Tylenol  and Advil .

## 2023-07-27 NOTE — ED Provider Notes (Signed)
 [facility]    CSN: [id number] Arrival date & time: 07/27/23  1027      History   Chief Complaint Chief Complaint  Patient presents with   Cough    HPI Kiara Klein is a 38 y.o. female.   Patient cleaned her entire house yesterday and noticed afterward she was unusually fatigued.  This morning she woke with hot and cold chills, generalized bodyaches, headache and a sore throat.  Has central chest pain with coughing, dry cough.  Has been taking Tylenol  and Advil  for her symptoms.  Denies any recent sick contacts.  No wheezing or shortness of breath.  The history is provided by the patient and medical records.  Cough   Past Medical History:  Diagnosis Date   Anxiety    Depression    Dizziness    HTN (hypertension)     Patient Active Problem List   Diagnosis Date Noted   Paresthesia 07/03/2020   Dizziness 07/03/2020   Depression, major, single episode, moderate (HCC) 03/25/2020   Anxiety 03/25/2020   Tibialis tendinitis of right lower extremity 10/11/2016    History reviewed. No pertinent surgical history.  OB History   No obstetric history on file.      Home Medications    Prior to Admission medications   Medication Sig Start Date End Date Taking? Authorizing Provider  benzonatate  (TESSALON ) 100 MG capsule Take 1 capsule (100 mg total) by mouth every 8 (eight) hours. 07/27/23  Yes Dreama, Matheau [place]  N, FNP  busPIRone (BUSPAR) 15 MG tablet Take by mouth. 10/13/21  Yes [provider]  escitalopram  (LEXAPRO ) 10 MG tablet Take 1 tablet (10 mg total) by mouth daily. appt required for future refills 07/21/20  Yes Just, Kelsea J, FNP  fluticasone  (FLONASE ) 50 MCG/ACT nasal spray Place 1 spray into both nostrils daily. 11/08/21  Yes Raspet, Erin K, PA-C  hydrochlorothiazide  (HYDRODIURIL ) 25 MG tablet Take 1 tablet (25 mg total) by mouth daily. 04/17/20  Yes Just, Kelsea J, FNP  meclizine  (ANTIVERT ) 25 MG tablet Take 1 tablet (25 mg total) by  mouth 3 (three) times daily as needed for dizziness. 04/17/20   Just, Kelsea J, FNP    Family History Family History  Problem Relation Age of Onset   Diabetes Mother    Heart disease Mother    Hypertension Mother    Prostate cancer Father    Hypertension Brother     Social History Social History   Tobacco Use   Smoking status: Never   Smokeless tobacco: Never  Vaping Use   Vaping status: Never Used  Substance Use Topics   Alcohol use: Yes    Alcohol/week: 0.0 standard drinks of alcohol    Comment: occ   Drug use: No     Allergies   Bactrim [sulfamethoxazole-trimethoprim] and Penicillins   Review of Systems Review of Systems  Per HPI   Physical Exam Triage Vital Signs ED Triage Vitals  Encounter Vitals Group     BP 07/27/23 1151 124/83     Systolic BP Percentile --      Diastolic BP Percentile --      Pulse Rate 07/27/23 1151 (!) 105     Resp 07/27/23 1151 18     Temp 07/27/23 1151 98.7 F (37.1 C)     Temp Source 07/27/23 1151 Oral     SpO2 07/27/23 1151 98 %     Weight 07/27/23 1152 204 lb (92.5 kg)     Height 07/27/23 1152 5'  7 (1.702 m)     Head Circumference --      Peak Flow --      Pain Score 07/27/23 1152 7     Pain Loc --      Pain Education --      Exclude from Growth Chart --    No data found.  Updated Vital Signs BP 124/83 (BP Location: Right Arm)   Pulse (!) 105   Temp 98.7 F (37.1 C) (Oral)   Resp 18   Ht 5' 7 (1.702 m)   Wt 204 lb (92.5 kg)   LMP 07/19/2023   SpO2 98%   BMI 31.95 kg/m   Visual Acuity Right Eye Distance:   Left Eye Distance:   Bilateral Distance:    Right Eye Near:   Left Eye Near:    Bilateral Near:     Physical Exam Vitals and nursing note reviewed.  Constitutional:      Appearance: Normal appearance.  HENT:     Head: Normocephalic and atraumatic.     Right Ear: External ear normal.     Left Ear: External ear normal.     Nose: Congestion and rhinorrhea present.     Mouth/Throat:      Mouth: Mucous membranes are moist.     Pharynx: Posterior oropharyngeal erythema present.  Eyes:     Conjunctiva/sclera: Conjunctivae normal.  Cardiovascular:     Rate and Rhythm: Normal rate and regular rhythm.     Heart sounds: Normal heart sounds. No murmur heard. Pulmonary:     Effort: Pulmonary effort is normal. No respiratory distress.     Breath sounds: Normal breath sounds. No wheezing.  Skin:    General: Skin is warm and dry.  Neurological:     General: No focal deficit present.     Mental Status: She is alert and oriented to person, place, and time.  Psychiatric:        Mood and Affect: Mood normal.        Behavior: Behavior normal.      UC Treatments / Results  Labs (all labs ordered are listed, but only abnormal results are displayed) Labs Reviewed  POCT INFLUENZA A/B - Normal    EKG   Radiology No results found.  Procedures Procedures (including critical care time)  Medications Ordered in UC Medications - No data to display  Initial Impression / Assessment and Plan / UC Course  I have reviewed the triage vital signs and the nursing notes.  Pertinent labs & imaging results that were available during my care of the patient were reviewed by me and considered in my medical decision making (see chart for details).  Vitals and triage reviewed, patient is hemodynamically stable.  Lungs are vesicular, heart with regular rate and rhythm.  Congestion and rhinorrhea present on physical exam.  POC influenza testing negative, suspect other viral URI.  Symptomatic management discussed.  Plan of care, follow-up care return precautions given, no questions at this time.  Work note provided.     Final Clinical Impressions(s) / UC Diagnoses   Final diagnoses:  Viral URI with cough     Discharge Instructions      Your symptoms are consistent with a viral illness, alternate between and her milligrams of ibuprofen  and 500 mg of Tylenol  every 4-6 hours for fever,  body aches, headache or pain.  Ensure you are staying well-hydrated with at least 64 ounces water and getting plenty of rest.  You can use the cough  medicine every 8 hours as needed.  1200 mg of Mucinex daily can help loosen up any secretions.  Your symptoms should improve over the next 5 to 7 days, if no improvement or any changes please return to clinic or follow-up with your primary care provider for re-evaluation.     ED Prescriptions     Medication Sig Dispense Auth. Provider   benzonatate  (TESSALON ) 100 MG capsule Take 1 capsule (100 mg total) by mouth every 8 (eight) hours. 21 capsule Dreama, Chanler Mendonca  N, FNP      PDMP not reviewed this encounter.   Dreama, Miana Politte  N, FNP 07/27/23 1249

## 2023-07-27 NOTE — Discharge Instructions (Addendum)
 Your symptoms are consistent with a viral illness, alternate between and her milligrams of ibuprofen  and 500 mg of Tylenol  every 4-6 hours for fever, body aches, headache or pain.  Ensure you are staying well-hydrated with at least 64 ounces water and getting plenty of rest.  You can use the cough medicine every 8 hours as needed.  1200 mg of Mucinex daily can help loosen up any secretions.  Your symptoms should improve over the next 5 to 7 days, if no improvement or any changes please return to clinic or follow-up with your primary care provider for re-evaluation.

## 2023-09-27 DIAGNOSIS — A64 Unspecified sexually transmitted disease: Secondary | ICD-10-CM | POA: Diagnosis not present

## 2023-09-27 DIAGNOSIS — Z202 Contact with and (suspected) exposure to infections with a predominantly sexual mode of transmission: Secondary | ICD-10-CM | POA: Diagnosis not present

## 2023-10-08 ENCOUNTER — Encounter (HOSPITAL_COMMUNITY): Payer: Self-pay | Admitting: Emergency Medicine

## 2023-10-08 ENCOUNTER — Other Ambulatory Visit: Payer: Self-pay

## 2023-10-08 ENCOUNTER — Ambulatory Visit (HOSPITAL_COMMUNITY)
Admission: EM | Admit: 2023-10-08 | Discharge: 2023-10-08 | Disposition: A | Discharge status: Home / Self Care | Attending: Physician Assistant | Admitting: Physician Assistant

## 2023-10-08 DIAGNOSIS — R899 Unspecified abnormal finding in specimens from other organs, systems and tissues: Secondary | ICD-10-CM | POA: Diagnosis not present

## 2023-10-08 DIAGNOSIS — Z113 Encounter for screening for infections with a predominantly sexual mode of transmission: Secondary | ICD-10-CM | POA: Insufficient documentation

## 2023-10-08 DIAGNOSIS — Z8619 Personal history of other infectious and parasitic diseases: Secondary | ICD-10-CM | POA: Insufficient documentation

## 2023-10-08 NOTE — ED Provider Notes (Signed)
 [facility]    CSN: [id number] Arrival date & time: 10/08/23  1136      History   Chief Complaint Chief Complaint  Patient presents with   Exposure to STD    HPI SUSEN HASKEW is a 38 y.o. female.   Patient presents today for evaluation of STI exposure.  She reports that she went to a different clinic a few weeks ago and had a complete STI panel.  Everything was negative except for testing for syphilis.  This was sent off for confirmation at [facility] and she was contacted today and told that she had syphilis.  She is concerned because she does not understand the testing.  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  She brings with her the lab results that show positive antibodies but nonreactive RPR.  She looked visibly on it and believes that this indicates a past infection but not a current infection.  She is unsure if she needs treatment.  She is confident that she is not pregnant.  She does have a history of hives with penicillin.  Denies any recent antibiotics.  She is requesting repeat testing for additional information.  She is asymptomatic.    Past Medical History:  Diagnosis Date   Anxiety    Depression    Dizziness    HTN (hypertension)     Patient Active Problem List   Diagnosis Date Noted   Paresthesia 07/03/2020   Dizziness 07/03/2020   Depression, major, single episode, moderate (HCC) 03/25/2020   Anxiety 03/25/2020   Tibialis tendinitis of right lower extremity 10/11/2016    History reviewed. No pertinent surgical history.  OB History   No obstetric history on file.      Home Medications    Prior to Admission medications   Medication Sig Start Date End Date Taking? Authorizing Provider  benzonatate  (TESSALON ) 100 MG capsule Take 1 capsule (100 mg total) by mouth every 8 (eight) hours. 07/27/23   Harlow Lighter, [place]  N, FNP  busPIRone (BUSPAR) 15 MG tablet Take by mouth. 10/13/21   [provider]  escitalopram   (LEXAPRO ) 10 MG tablet Take 1 tablet (10 mg total) by mouth daily. appt required for future refills 07/21/20   Just, Kelsea J, FNP  fluticasone  (FLONASE ) 50 MCG/ACT nasal spray Place 1 spray into both nostrils daily. 11/08/21   Kariel Skillman K, PA-C  hydrochlorothiazide  (HYDRODIURIL ) 25 MG tablet Take 1 tablet (25 mg total) by mouth daily. 04/17/20   Just, Kelsea J, FNP  meclizine  (ANTIVERT ) 25 MG tablet Take 1 tablet (25 mg total) by mouth 3 (three) times daily as needed for dizziness. 04/17/20   Just, Kelsea J, FNP    Family History Family History  Problem Relation Age of Onset   Diabetes Mother    Heart disease Mother    Hypertension Mother    Prostate cancer Father    Hypertension Brother     Social History Social History   Tobacco Use   Smoking status: Never   Smokeless tobacco: Never  Vaping Use   Vaping status: Never Used  Substance Use Topics   Alcohol use: Yes    Alcohol/week: 0.0 standard drinks of alcohol    Comment: occ   Drug use: No     Allergies   Bactrim [sulfamethoxazole-trimethoprim] and Penicillins   Review of Systems Review of Systems  Constitutional:  Negative for activity change, appetite change, fatigue and fever.  Gastrointestinal:  Negative for abdominal pain, diarrhea, nausea and vomiting.  Genitourinary:  Negative for frequency, genital sores, pelvic pain, vaginal bleeding, vaginal discharge and vaginal pain.  Musculoskeletal:  Negative for arthralgias and myalgias.  Skin:  Negative for rash.     Physical Exam Triage Vital Signs ED Triage Vitals  Encounter Vitals Group     BP 10/08/23 1237 130/85     Systolic BP Percentile --      Diastolic BP Percentile --      Pulse Rate 10/08/23 1237 100     Resp 10/08/23 1237 18     Temp 10/08/23 1237 98.3 F (36.8 C)     Temp Source 10/08/23 1237 Oral     SpO2 10/08/23 1237 98 %     Weight --      Height --      Head Circumference --      Peak Flow --      Pain Score 10/08/23 1238 0     Pain  Loc --      Pain Education --      Exclude from Growth Chart --    No data found.  Updated Vital Signs BP 130/85 (BP Location: Right Arm)   Pulse 100   Temp 98.3 F (36.8 C) (Oral)   Resp 18   LMP 09/27/2023 (Approximate)   SpO2 98%   Visual Acuity Right Eye Distance:   Left Eye Distance:   Bilateral Distance:    Right Eye Near:   Left Eye Near:    Bilateral Near:     Physical Exam Vitals reviewed.  Constitutional:      General: She is awake. She is not in acute distress.    Appearance: Normal appearance. She is well-developed. She is not ill-appearing.     Comments: Very pleasant female appears stated age no acute distress sitting comfortably in exam room  HENT:     Head: Normocephalic and atraumatic.  Cardiovascular:     Rate and Rhythm: Normal rate and regular rhythm.     Heart sounds: Normal heart sounds, S1 normal and S2 normal. No murmur heard. Pulmonary:     Effort: Pulmonary effort is normal.     Breath sounds: Normal breath sounds. No wheezing, rhonchi or rales.     Comments: Clear to auscultation bilaterally Abdominal:     General: Bowel sounds are normal.     Palpations: Abdomen is soft.     Tenderness: There is no abdominal tenderness.  Psychiatric:        Behavior: Behavior is cooperative.      UC Treatments / Results  Labs (all labs ordered are listed, but only abnormal results are displayed) Labs Reviewed  RPR  CERVICOVAGINAL ANCILLARY ONLY    EKG   Radiology No results found.  Procedures Procedures (including critical care time)  Medications Ordered in UC Medications - No data to display  Initial Impression / Assessment and Plan / UC Course  I have reviewed the triage vital signs and the nursing notes.  Pertinent labs & imaging results that were available during my care of the patient were reviewed by me and considered in my medical decision making (see chart for details).     Reviewed lab results with patient and discussed  that this indicates past infection which is expected given she has had syphilis in the past and received appropriate treatment.  Since her RPR is nonreactive I have low suspicion for recurrent infection.  She was interested in having this repeated and so RPR reflex was obtained and we will  contact her with these results.  She also requested repeat gonorrhea, chlamydia, trichomonas testing which was obtained.  If her RPR titer is elevated and she requires treatment she would need to do doxycycline  100 mg twice daily for 14 days given her penicillin allergy.  She has no concern for pregnancy.  We will contact her with her lab results if anything is abnormal and requires treatment.  All questions were answered to patient satisfaction.  She expressed understanding of her test results but was encouraged to contact us  if she has any questions.   Final Clinical Impressions(s) / UC Diagnoses   Final diagnoses:  Screening examination for STI  Abnormal laboratory test  History of syphilis     Discharge Instructions      As we discussed, I believe that your testing indicates a history of syphilis and not a current infection.  We will contact you if any of your testing is abnormal.  Please contact us  with any questions.     ED Prescriptions   None    PDMP not reviewed this encounter.   Budd Cargo, PA-C 10/08/23 1347

## 2023-10-08 NOTE — Discharge Instructions (Signed)
 As we discussed, I believe that your testing indicates a history of syphilis and not a current infection.  We will contact you if any of your testing is abnormal.  Please contact us  with any questions.

## 2023-10-08 NOTE — ED Triage Notes (Addendum)
 Pt is requesting to be check for STD. Pt reports some one called her today ans states that she was positive for syphilis.

## 2023-10-09 LAB — RPR
RPR Ser Ql: REACTIVE — AB
RPR Titer: 1:1 {titer}

## 2023-10-10 LAB — CERVICOVAGINAL ANCILLARY ONLY
Chlamydia: NEGATIVE
Comment: NEGATIVE
Comment: NEGATIVE
Comment: NORMAL
Neisseria Gonorrhea: NEGATIVE
Trichomonas: NEGATIVE

## 2023-10-11 LAB — T.PALLIDUM AB, TOTAL: T Pallidum Abs: REACTIVE — AB

## 2023-10-14 DIAGNOSIS — Z113 Encounter for screening for infections with a predominantly sexual mode of transmission: Secondary | ICD-10-CM | POA: Diagnosis not present

## 2023-10-14 DIAGNOSIS — Z8619 Personal history of other infectious and parasitic diseases: Secondary | ICD-10-CM | POA: Diagnosis not present

## 2023-10-14 DIAGNOSIS — Z114 Encounter for screening for human immunodeficiency virus [HIV]: Secondary | ICD-10-CM | POA: Diagnosis not present

## 2023-10-19 DIAGNOSIS — K5900 Constipation, unspecified: Secondary | ICD-10-CM | POA: Insufficient documentation

## 2023-10-19 DIAGNOSIS — Z13220 Encounter for screening for lipoid disorders: Secondary | ICD-10-CM | POA: Diagnosis not present

## 2023-10-19 DIAGNOSIS — I10 Essential (primary) hypertension: Secondary | ICD-10-CM | POA: Diagnosis not present

## 2023-10-19 DIAGNOSIS — Z0000 Encounter for general adult medical examination without abnormal findings: Secondary | ICD-10-CM | POA: Diagnosis not present

## 2023-10-19 DIAGNOSIS — Z1330 Encounter for screening examination for mental health and behavioral disorders, unspecified: Secondary | ICD-10-CM | POA: Diagnosis not present

## 2023-10-19 DIAGNOSIS — Z8619 Personal history of other infectious and parasitic diseases: Secondary | ICD-10-CM | POA: Insufficient documentation

## 2023-10-19 DIAGNOSIS — F321 Major depressive disorder, single episode, moderate: Secondary | ICD-10-CM | POA: Diagnosis not present

## 2023-10-19 DIAGNOSIS — Z23 Encounter for immunization: Secondary | ICD-10-CM | POA: Diagnosis not present

## 2023-10-19 DIAGNOSIS — E559 Vitamin D deficiency, unspecified: Secondary | ICD-10-CM | POA: Diagnosis not present

## 2023-10-29 DIAGNOSIS — E559 Vitamin D deficiency, unspecified: Secondary | ICD-10-CM | POA: Insufficient documentation

## 2023-11-23 DIAGNOSIS — F419 Anxiety disorder, unspecified: Secondary | ICD-10-CM | POA: Diagnosis not present

## 2023-11-23 DIAGNOSIS — F321 Major depressive disorder, single episode, moderate: Secondary | ICD-10-CM | POA: Diagnosis not present

## 2023-11-23 DIAGNOSIS — E559 Vitamin D deficiency, unspecified: Secondary | ICD-10-CM | POA: Diagnosis not present

## 2023-11-23 DIAGNOSIS — I10 Essential (primary) hypertension: Secondary | ICD-10-CM | POA: Diagnosis not present

## 2024-01-06 ENCOUNTER — Ambulatory Visit
Admission: RE | Admit: 2024-01-06 | Discharge: 2024-01-06 | Disposition: A | Discharge status: Home / Self Care | Source: Ambulatory Visit

## 2024-01-06 VITALS — BP 141/100 | HR 96 | Temp 98.9°F | Resp 16

## 2024-01-06 DIAGNOSIS — R300 Dysuria: Secondary | ICD-10-CM

## 2024-01-06 DIAGNOSIS — R350 Frequency of micturition: Secondary | ICD-10-CM | POA: Insufficient documentation

## 2024-01-06 DIAGNOSIS — Z113 Encounter for screening for infections with a predominantly sexual mode of transmission: Secondary | ICD-10-CM | POA: Insufficient documentation

## 2024-01-06 LAB — POCT URINALYSIS DIP (MANUAL ENTRY)
Bilirubin, UA: NEGATIVE
Glucose, UA: NEGATIVE mg/dL
Ketones, POC UA: NEGATIVE mg/dL
Nitrite, UA: NEGATIVE
Protein Ur, POC: 100 mg/dL — AB
Spec Grav, UA: >=1.03 — AB (ref 1.010–1.025)
Urobilinogen, UA: 0.2 U/dL
pH, UA: 6 (ref 5.0–8.0)

## 2024-01-06 MED ORDER — NITROFURANTOIN MONOHYD MACRO 100 MG PO CAPS: 100.0000 mg | ORAL_CAPSULE | Freq: Two times a day (BID) | ORAL | 0 refills | Status: AC

## 2024-01-06 NOTE — Discharge Instructions (Signed)
 We have sent off your swab and urine for culture.  We should get these results in about 3 to 5 days.  We will call you at that time if we need to make any changes to your medications.  We have sent in an antibiotic to cover for UTI.  We recommend following up with your primary care provider if symptoms are not improving.  Please return or go to the emergency department if you have any severe abdominal pain, vomiting, worsening symptoms, fever, or if you have any other concerns.

## 2024-01-06 NOTE — ED Triage Notes (Signed)
 Pt states she has had urinary symptoms including urinary frequency, pelvic pressure, and lower abdomen pain/pressure. Pt states she also would like a STD Testing swab today for a general check up as well.

## 2024-01-06 NOTE — ED Provider Notes (Signed)
 Kiara Klein    CSN: [id number] Arrival date & time: 01/06/24  1404      History   Chief Complaint Chief Complaint  Patient presents with   Urinary Frequency    UTI symptoms since 01/02/2024 - Entered by patient   STD Test    HPI Kiara Klein is a 38 y.o. female.   Patient is a 38 year old female who presents to the urgent care today with concerns of urinary frequency, burning with urination, and lower abdominal pressure with urination.  She reports that her symptoms began several days ago.  She would also like to be tested for STIs today as she recently had a new partner.  She denies any vaginal discharge, rash, fever, vomiting, abdominal pain, back pain, chills, vaginal irritation, or other concerns at this time.    Past Medical History:  Diagnosis Date   Anxiety    Depression    Dizziness    HTN (hypertension)     Patient Active Problem List   Diagnosis Date Noted   Vitamin D deficiency 10/29/2023   Constipation 10/19/2023   History of syphilis 10/19/2023   Hypertension 10/19/2023   COVID-19 virus infection 03/03/2022   BMI 29.0-29.9,adult 10/13/2021   BMI 31.0-31.9,adult 10/13/2021   Paresthesia 07/03/2020   Dizziness 07/03/2020   Depression, major, single episode, moderate (HCC) 03/25/2020   Anxiety 03/25/2020   Tibialis tendinitis of right lower extremity 10/11/2016    Past Surgical History:  Procedure Laterality Date   NO PAST SURGERIES      OB History     Gravida  1   Para      Term      Preterm      AB      Living         SAB      IAB      Ectopic      Multiple      Live Births  1            Home Medications    Prior to Admission medications   Medication Sig Start Date End Date Taking? Authorizing Provider  hydrochlorothiazide  (HYDRODIURIL ) 25 MG tablet Take 1 tablet (25 mg total) by mouth daily. 04/17/20  Yes Just, Kelsea J, FNP  nitrofurantoin, macrocrystal-monohydrate, (MACROBID) 100 MG capsule Take  1 capsule (100 mg total) by mouth 2 (two) times daily. 01/06/24  Yes Melonie Locus, PA-C  benzonatate  (TESSALON ) 100 MG capsule Take 1 capsule (100 mg total) by mouth every 8 (eight) hours. 07/27/23   Dreama, [place]  N, FNP  busPIRone (BUSPAR) 15 MG tablet Take by mouth. 10/13/21   [provider]  Cholecalciferol 50 MCG (2000 UT) TABS Take 2,000 Units by mouth.    [provider]  escitalopram  (LEXAPRO ) 10 MG tablet Take 1 tablet (10 mg total) by mouth daily. appt required for future refills 07/21/20   Just, Kelsea J, FNP  fluticasone  (FLONASE ) 50 MCG/ACT nasal spray Place 1 spray into both nostrils daily. 11/08/21   Raspet, Erin K, PA-C  meclizine  (ANTIVERT ) 25 MG tablet Take 1 tablet (25 mg total) by mouth 3 (three) times daily as needed for dizziness. 04/17/20   Just, Kelsea J, FNP  Vitamin D, Ergocalciferol, (DRISDOL) 1.25 MG (50000 UNIT) CAPS capsule Take 50,000 Units by mouth once a week.    [provider]    Family History Family History  Problem Relation Age of Onset   Diabetes Mother    Heart disease Mother  Hypertension Mother    Prostate cancer Father    Hypertension Brother     Social History Social History   Tobacco Use   Smoking status: Never    Passive exposure: Never   Smokeless tobacco: Never  Vaping Use   Vaping status: Never Used  Substance Use Topics   Alcohol use: Yes    Alcohol/week: 0.0 standard drinks of alcohol    Comment: occ   Drug use: No     Allergies   Bactrim [sulfamethoxazole-trimethoprim] and Penicillins   Review of Systems Review of Systems See HPI for relevant ROS.  Physical Exam Triage Vital Signs ED Triage Vitals  Encounter Vitals Group     BP 01/06/24 1416 (!) 141/100     Girls Systolic BP Percentile --      Girls Diastolic BP Percentile --      Boys Systolic BP Percentile --      Boys Diastolic BP Percentile --      Pulse Rate 01/06/24 1416 96     Resp 01/06/24 1416 16     Temp 01/06/24 1416  98.9 F (37.2 C)     Temp Source 01/06/24 1416 Oral     SpO2 01/06/24 1416 97 %     Weight --      Height --      Head Circumference --      Peak Flow --      Pain Score 01/06/24 1427 4     Pain Loc --      Pain Education --      Exclude from Growth Chart --    No data found.  Updated Vital Signs BP (!) 141/100 (BP Location: Right Arm)   Pulse 96   Temp 98.9 F (37.2 C) (Oral)   Resp 16   LMP 12/28/2023 (Approximate)   SpO2 97%   Visual Acuity Right Eye Distance:   Left Eye Distance:   Bilateral Distance:    Right Eye Near:   Left Eye Near:    Bilateral Near:     Physical Exam General: Alert and oriented, well-developed/well-nourished, calm, cooperative, no acute distress HEENT: Normocephalic atraumatic, moist mucous membranes, no scleral icterus, trachea midline Lungs: Speaking full sentences, non-labored respirations, no distress Heart: Regular rate and rhythm Abdomen:  Soft, nondistended, nontender to palpation Musculoskeletal: Moves all extremities well Back: No CVA tenderness Neurologic: Awake, A&O x4, gait normal Integumentary: Warm, dry, normal for ethnicity, intact, no rash Psychiatric: Appropriate mood & affect  Klein Treatments / Results  Labs (all labs ordered are listed, but only abnormal results are displayed) Labs Reviewed  POCT URINALYSIS DIP (MANUAL ENTRY) - Abnormal; Notable for the following components:      Result Value   Color, UA light yellow (*)    Clarity, UA cloudy (*)    Spec Grav, UA >=1.030 (*)    Blood, UA moderate (*)    Protein Ur, POC =100 (*)    Leukocytes, UA Small (1+) (*)    All other components within normal limits  URINE CULTURE  CERVICOVAGINAL ANCILLARY ONLY    EKG   Radiology No results found.  Procedures Procedures (including critical care time)  Medications Ordered in Klein Medications - No data to display  Initial Impression / Assessment and Plan / Klein Course  I have reviewed the triage vital signs and the  nursing notes.  Pertinent labs & imaging results that were available during my care of the patient were reviewed by me and considered in my  medical decision making (see chart for details).    Patient presents with dysuria.  Differential diagnosis includes: Acute uncomplicated cystitis, pyelonephritis, nephrolithiasis, ureterolithiasis, ectopic pregnancy, ovarian torsion, ovarian cyst, appendicitis, including other diagnoses.  History obtained from: Patient.  Plan at this time/rationale: UA, urine culture, vaginal swab.  All ordered tests including imaging and labs were independently reviewed and interpreted by myself. Notable findings: UA positive for leukocytes and blood.  Plan: This patient presents with symptoms consistent with acute uncomplicated cystitis. No systemic symptoms. Not septic. Well appearing. Low suspicion for acute pyelonephritis given lack of fever, CVAT, or systemic features. Low suspicion for kidney stone or infected stone. Low suspicion for ovarian torsion, PID, or appendicitis. Prescribed patient Macrobid.  Additionally, patient performed a self swab to screen for STIs.  Patient does not have any symptoms currently but would like to be tested because she recently had a new sexual partner. Patient should follow up with their PCP in the next several days. Return precautions to the urgent care or emergency department were discussed including if symptoms worsen, they start having any systemic symptoms, or if they have any other concerns.  Disposition: Stable to discharge home.  All questions answered to the best of this examiner's ability. Advised to f/u with PCP for further eval and/or reassessment. Patient agrees to plan.  An appropriate evaluation has been performed, and in my medical judgment there is currently no evidence of an immediate life-threatening or surgical condition. Discharge is therefore indicated at this time.  This document was created using the aid of  voice recognition Scientist, clinical (histocompatibility and immunogenetics).  Final Clinical Impressions(s) / Klein Diagnoses   Final diagnoses:  Dysuria     Discharge Instructions      We have sent off your swab and urine for culture.  We should get these results in about 3 to 5 days.  We will call you at that time if we need to make any changes to your medications.  We have sent in an antibiotic to cover for UTI.  We recommend following up with your primary care provider if symptoms are not improving.  Please return or go to the emergency department if you have any severe abdominal pain, vomiting, worsening symptoms, fever, or if you have any other concerns.    ED Prescriptions     Medication Sig Dispense Auth. Provider   nitrofurantoin, macrocrystal-monohydrate, (MACROBID) 100 MG capsule Take 1 capsule (100 mg total) by mouth 2 (two) times daily. 10 capsule Melonie Locus, PA-C      PDMP not reviewed this encounter.   Melonie Locus, PA-C 01/06/24 1451

## 2024-01-09 ENCOUNTER — Ambulatory Visit (HOSPITAL_COMMUNITY): Payer: Self-pay

## 2024-01-09 LAB — CERVICOVAGINAL ANCILLARY ONLY
Bacterial Vaginitis (gardnerella): POSITIVE — AB
Candida Glabrata: NEGATIVE
Candida Vaginitis: NEGATIVE
Chlamydia: NEGATIVE
Comment: NEGATIVE
Comment: NEGATIVE
Comment: NEGATIVE
Comment: NEGATIVE
Comment: NEGATIVE
Comment: NORMAL
Neisseria Gonorrhea: NEGATIVE
Trichomonas: NEGATIVE

## 2024-01-09 MED ORDER — METRONIDAZOLE 500 MG PO TABS: 500.0000 mg | ORAL_TABLET | Freq: Two times a day (BID) | ORAL | 0 refills | Status: AC

## 2024-03-13 DIAGNOSIS — Z23 Encounter for immunization: Secondary | ICD-10-CM | POA: Diagnosis not present

## 2024-03-13 DIAGNOSIS — I10 Essential (primary) hypertension: Secondary | ICD-10-CM | POA: Diagnosis not present

## 2024-03-13 DIAGNOSIS — F321 Major depressive disorder, single episode, moderate: Secondary | ICD-10-CM | POA: Diagnosis not present

## 2024-03-13 DIAGNOSIS — F419 Anxiety disorder, unspecified: Secondary | ICD-10-CM | POA: Diagnosis not present

## 2024-03-13 DIAGNOSIS — N926 Irregular menstruation, unspecified: Secondary | ICD-10-CM | POA: Diagnosis not present

## 2024-04-30 ENCOUNTER — Emergency Department (HOSPITAL_COMMUNITY)
Admission: EM | Admit: 2024-04-30 | Discharge: 2024-04-30 | Disposition: A | Discharge status: Home / Self Care | Attending: Emergency Medicine | Admitting: Emergency Medicine

## 2024-04-30 ENCOUNTER — Other Ambulatory Visit: Payer: Self-pay

## 2024-04-30 DIAGNOSIS — R1084 Generalized abdominal pain: Secondary | ICD-10-CM | POA: Diagnosis not present

## 2024-04-30 DIAGNOSIS — R109 Unspecified abdominal pain: Secondary | ICD-10-CM | POA: Diagnosis not present

## 2024-04-30 LAB — CBC
HCT: 48.1 % — ABNORMAL HIGH (ref 36.0–46.0)
Hemoglobin: 16 g/dL — ABNORMAL HIGH (ref 12.0–15.0)
MCH: 28.4 pg (ref 26.0–34.0)
MCHC: 33.3 g/dL (ref 30.0–36.0)
MCV: 85.4 fL (ref 80.0–100.0)
Platelets: 254 K/uL (ref 150–400)
RBC: 5.63 MIL/uL — ABNORMAL HIGH (ref 3.87–5.11)
RDW: 13.9 % (ref 11.5–15.5)
WBC: 5.5 K/uL (ref 4.0–10.5)
nRBC: 0 % (ref 0.0–0.2)

## 2024-04-30 LAB — COMPREHENSIVE METABOLIC PANEL WITH GFR
ALT: 13 U/L (ref 0–44)
AST: 27 U/L (ref 15–41)
Albumin: 4.5 g/dL (ref 3.5–5.0)
Alkaline Phosphatase: 100 U/L (ref 38–126)
Anion gap: 9 (ref 5–15)
BUN: 12 mg/dL (ref 6–20)
CO2: 25 mmol/L (ref 22–32)
Calcium: 9.3 mg/dL (ref 8.9–10.3)
Chloride: 106 mmol/L (ref 98–111)
Creatinine, Ser: 0.96 mg/dL (ref 0.44–1.00)
GFR, Estimated: >60 mL/min (ref >60)
Glucose, Bld: 93 mg/dL (ref 70–99)
Potassium: 3.8 mmol/L (ref 3.5–5.1)
Sodium: 140 mmol/L (ref 135–145)
Total Bilirubin: 0.6 mg/dL (ref 0.0–1.2)
Total Protein: 8.3 g/dL — ABNORMAL HIGH (ref 6.5–8.1)

## 2024-04-30 LAB — URINALYSIS, ROUTINE W REFLEX MICROSCOPIC
Bilirubin Urine: NEGATIVE
Glucose, UA: NEGATIVE mg/dL
Hgb urine dipstick: NEGATIVE
Ketones, ur: NEGATIVE mg/dL
Leukocytes,Ua: NEGATIVE
Nitrite: NEGATIVE
Protein, ur: NEGATIVE mg/dL
Specific Gravity, Urine: 1.02 (ref 1.005–1.030)
pH: 7 (ref 5.0–8.0)

## 2024-04-30 LAB — HCG, SERUM, QUALITATIVE: Preg, Serum: NEGATIVE

## 2024-04-30 LAB — LIPASE, BLOOD: Lipase: 51 U/L (ref 11–51)

## 2024-04-30 NOTE — ED Notes (Signed)
 Pt refused the GC/ Chlamydia swab. Pt stated that she will be going to her PCP on Friday and will wait to speak with them before doing anything further.

## 2024-04-30 NOTE — ED Provider Notes (Signed)
 [place] EMERGENCY DEPARTMENT AT [facility] Provider Note   CSN: [id number] Arrival date & time: 04/30/24  8095     Patient presents with: Abdominal Pain   Kiara Klein is a 38 y.o. female.   38 yo F with a chief complaints of abdominal pain.  This been an ongoing issue for her.  She seen her family doctor multiple times for this as well as seen GYN recently.  Family doctor thought maybe it was an OB/GYN related issue.  Symptoms seem to get worse around her menstrual cycles.  She saw OB/GYN and had a pelvic ultrasound and a pelvic exam.  Thought to be unlikely to be pelvic in etiology.  Thought to be may be due to her back pain.  Patient with continued pain perhaps worse over the past week to few days.  No fevers no vomiting.  No urinary symptoms.   Abdominal Pain      Prior to Admission medications   Medication Sig Start Date End Date Taking? Authorizing Provider  benzonatate  (TESSALON ) 100 MG capsule Take 1 capsule (100 mg total) by mouth every 8 (eight) hours. 07/27/23   Dreama, [place]  N, FNP  busPIRone (BUSPAR) 15 MG tablet Take by mouth. 10/13/21   [provider]  Cholecalciferol 50 MCG (2000 UT) TABS Take 2,000 Units by mouth.    [provider]  escitalopram  (LEXAPRO ) 10 MG tablet Take 1 tablet (10 mg total) by mouth daily. appt required for future refills 07/21/20   Just, Kelsea J, FNP  fluticasone  (FLONASE ) 50 MCG/ACT nasal spray Place 1 spray into both nostrils daily. 11/08/21   Raspet, Erin K, PA-C  hydrochlorothiazide  (HYDRODIURIL ) 25 MG tablet Take 1 tablet (25 mg total) by mouth daily. 04/17/20   Just, Kelsea J, FNP  meclizine  (ANTIVERT ) 25 MG tablet Take 1 tablet (25 mg total) by mouth 3 (three) times daily as needed for dizziness. 04/17/20   Just, Kelsea J, FNP  nitrofurantoin , macrocrystal-monohydrate, (MACROBID ) 100 MG capsule Take 1 capsule (100 mg total) by mouth 2 (two) times daily. 01/06/24   Melonie Locus, PA-C   Vitamin D, Ergocalciferol, (DRISDOL) 1.25 MG (50000 UNIT) CAPS capsule Take 50,000 Units by mouth once a week.    [provider]    Allergies: Bactrim [sulfamethoxazole-trimethoprim] and Penicillins    Review of Systems  Gastrointestinal:  Positive for abdominal pain.    Updated Vital Signs BP (!) 149/108   Pulse (!) 109   Temp 97.9 F (36.6 C) (Oral)   Resp 17   LMP 03/22/2024   SpO2 100%   Physical Exam Vitals and nursing note reviewed.  Constitutional:      General: She is not in acute distress.    Appearance: She is well-developed. She is not diaphoretic.  HENT:     Head: Normocephalic and atraumatic.  Eyes:     Pupils: Pupils are equal, round, and reactive to light.  Cardiovascular:     Rate and Rhythm: Normal rate and regular rhythm.     Heart sounds: No murmur heard.    No friction rub. No gallop.  Pulmonary:     Effort: Pulmonary effort is normal.     Breath sounds: No wheezing or rales.  Abdominal:     General: There is no distension.     Palpations: Abdomen is soft.     Tenderness: There is no abdominal tenderness.     Comments: Benign abdominal exam  Musculoskeletal:        General: No  tenderness.     Cervical back: Normal range of motion and neck supple.  Skin:    General: Skin is warm and dry.  Neurological:     Mental Status: She is alert and oriented to person, place, and time.  Psychiatric:        Behavior: Behavior normal.     (all labs ordered are listed, but only abnormal results are displayed) Labs Reviewed  COMPREHENSIVE METABOLIC PANEL WITH GFR - Abnormal; Notable for the following components:      Result Value   Total Protein 8.3 (*)    All other components within normal limits  CBC - Abnormal; Notable for the following components:   RBC 5.63 (*)    Hemoglobin 16.0 (*)    HCT 48.1 (*)    All other components within normal limits  LIPASE, BLOOD  URINALYSIS, ROUTINE W REFLEX MICROSCOPIC  HCG, SERUM, QUALITATIVE   GC/CHLAMYDIA PROBE AMP ([place]) NOT AT [facility]    EKG: None  Radiology: No results found.   Procedures   Medications Ordered in the ED - No data to display                                  Medical Decision Making Amount and/or Complexity of Data Reviewed Labs: ordered.   38 yo F with a chief complaint of abdominal pain.  This has been going on for a while now.  May be worse over the past week.  Lab work here without leukocytosis, no significant electrolyte abnormalities.  No LFT elevation.  Lipase normal.  UA negative for infection.  Pregnancy test negative.  Patient has seen her PCP and GYN for this.  Will have her trial miralax  cleanout.  9:57 PM:  I have discussed the diagnosis/risks/treatment options with the patient.  Evaluation and diagnostic testing in the emergency department does not suggest an emergent condition requiring admission or immediate intervention beyond what has been performed at this time.  They will follow up with GI. We also discussed returning to the ED immediately if new or worsening sx occur. We discussed the sx which are most concerning (e.g., sudden worsening pain, fever, inability to tolerate by mouth) that necessitate immediate return. Medications administered to the patient during their visit and any new prescriptions provided to the patient are listed below.  Medications given during this visit Medications - No data to display   The patient appears reasonably screen and/or stabilized for discharge and I doubt any other medical condition or other [facility] requiring further screening, evaluation, or treatment in the ED at this time prior to discharge.       Final diagnoses:  Generalized abdominal pain    ED Discharge Orders     None          Emil Share, DO 04/30/24 2157

## 2024-04-30 NOTE — Discharge Instructions (Signed)
Take 8 scoops of miralax in 32oz of whatever you would like to drink.(Gatorade comes in this size) You can also use a fleets enema which you can buy over the counter at the pharmacy.  Return for worsening abdominal pain, vomiting or fever. ? ?

## 2024-04-30 NOTE — ED Triage Notes (Signed)
 Generalized abd pain and right lower back pain all weekend, took mag citrate with mild improvement. Also reports ongoing lower pelvic pain all year, had recent transvaginal US  that was negative.  Has Increased , no vomiting.

## 2024-05-01 DIAGNOSIS — Z113 Encounter for screening for infections with a predominantly sexual mode of transmission: Secondary | ICD-10-CM | POA: Diagnosis not present

## 2024-05-01 DIAGNOSIS — A64 Unspecified sexually transmitted disease: Secondary | ICD-10-CM | POA: Diagnosis not present

## 2024-05-01 DIAGNOSIS — Z202 Contact with and (suspected) exposure to infections with a predominantly sexual mode of transmission: Secondary | ICD-10-CM | POA: Diagnosis not present

## 2024-05-02 LAB — GC/CHLAMYDIA PROBE AMP (~~LOC~~) NOT AT ARMC
Chlamydia: NEGATIVE
Comment: NEGATIVE
Comment: NORMAL
Neisseria Gonorrhea: NEGATIVE

## 2024-05-04 DIAGNOSIS — I10 Essential (primary) hypertension: Secondary | ICD-10-CM | POA: Diagnosis not present

## 2024-05-04 DIAGNOSIS — R1030 Lower abdominal pain, unspecified: Secondary | ICD-10-CM | POA: Diagnosis not present

## 2024-05-04 DIAGNOSIS — K5900 Constipation, unspecified: Secondary | ICD-10-CM | POA: Diagnosis not present

## 2024-05-04 NOTE — Progress Notes (Signed)
 Patient:  Kiara Klein    DOB: 1985-07-29, age 38 y.o. MRN: [id number]  PCP: Bernita Ned, PA-C  Computer technology was used to create visit note. Consent from the patient/caregiver was obtained prior to its use.       Assessment and Plan   1. Lower abdominal pain (Primary) -     CT Abdomen Pelvis W IV Contrast; Future; Expected date: 05/11/2024 2. Constipation, unspecified constipation type    Assessment & Plan 1. Lower abdominal pain: - The patient has been experiencing lower abdominal pain for which she has seen multiple healthcare providers, including OB/GYN and the emergency department. The OB/GYN ruled out gynecological issues, and the emergency department suggested constipation as a potential cause. - She has been using magnesium citrate and MiraLAX , with MiraLAX  providing some relief but still requiring straining during bowel movements. Her stool consistency varies, with [place] type IV or V when using MiraLAX , type VII (watery) after using magnesium citrate, and type I when constipated. - A CT scan of the abdomen and pelvis will be scheduled to rule out other potential causes. If the CT scan shows constipation, treatment for constipation will continue. If other issues are identified, they will be addressed accordingly. - She is advised to continue using MiraLAX , increase water intake, consume more dietary fiber, and maintain physical activity. A referral to gastroenterology will be made for further evaluation.  Follow-up: The patient is already scheduled for a wellness visit in 10/2024.    Follow up for visit as previously scheduled, sooner if needed.  Risks, benefits, and alternatives of the medications and treatment plan prescribed today were discussed, and patient expressed understanding. Plan follow-up as discussed or as needed if any worsening symptoms or change in condition.  Patient voiced understanding of the treatment plan and agreed to attempt to  comply.      Subjective   This a 38 y.o. female who presents with:     Patient presents with  . Abdominal Pain     Chief complaint comments reviewed and discussed with patient in detail.  HPI   This 38 y.o. female presents for evaluation of stomach pain.  She presented to the emergency department on 04/30/2024 reporting abdominal pain, for which she had been evaluated both here and with OB/GYN.  On exam, abdomen was soft and nontender without distention.  Comprehensive metabolic panel was notable for total protein of 8.3.  CBC notable for elevated hemoglobin at 16.0 with hematocrit 48.1, RBC 5.63.  Lipase, urinalysis, qualitative serum hCG were normal.  GC/chlamydia probe ordered, but patient refused, indicating that she would follow-up here today.  Constipation thought possible etiology and she was prescribed MiraLAX  and advised to follow-up with GI.  History of Present Illness The patient is a 38 year old woman who presents for reevaluation of lower abdominal pain.  She has been experiencing persistent lower abdominal pain, which has been evaluated by an OB/GYN and determined to be non-gynecological. During a recent visit to the emergency department, it was suggested that her symptoms might be due to constipation, and an enema was recommended. She has been managing her condition with magnesium citrate and MiraLAX , the latter of which has provided some relief. However, she continues to experience mild straining during bowel movements. Her stools have been soft while on MiraLAX , but she reports hard, lumpy stools when constipated. Following the administration of magnesium citrate, she experienced watery stools.  She has undergone testing for gonorrhea and Chlamydia, both of which returned negative results. She has  been attempting to increase her water intake and has been supplementing her diet with fiber pills.  Diet: She has been supplementing her diet with fiber pills.     Reviewed  and updated this visit by provider: Tobacco  Allergies  Meds  Problems  Med Hx  Surg Hx  Fam Hx        Review of Systems    As above.   Objective   Vitals:   05/04/24 1054  BP: 118/86  Patient Position: Sitting  Pulse: 101  Temp: 97.8 F (36.6 C)  TempSrc: Temporal  Height: 5' 7 (1.702 m)  Weight: 198 lb 9.6 oz (90.1 kg)  SpO2: 98%  BMI (Calculated): 31.1    Physical Exam Constitutional:      General: She is awake. She is not in acute distress.    Appearance: Normal appearance. She is well-developed and well-groomed. She is not ill-appearing.  HENT:     Head: Normocephalic.     Right Ear: Hearing normal.     Left Ear: Hearing normal.  Eyes:     General: Lids are normal. No scleral icterus.       Right eye: No discharge.        Left eye: No discharge.     Conjunctiva/sclera: Conjunctivae normal.  Cardiovascular:     Rate and Rhythm: Normal rate.  Musculoskeletal:     Cervical back: Neck supple. No tenderness.     Right lower leg: No edema.     Left lower leg: No edema.  Pulmonary:     Effort: Pulmonary effort is normal.  Abdominal:     General: Bowel sounds are normal. There is no distension.     Palpations: Abdomen is soft. There is no mass.     Tenderness: There is abdominal tenderness (mild, lower quadrants). There is no guarding or rebound.     Hernia: No hernia is present.  Lymphadenopathy:     Cervical: No cervical adenopathy.  Skin:    General: Skin is warm and dry.  Neurological:     Mental Status: She is alert and oriented to person, place, and time.  Psychiatric:        Attention and Perception: Attention and perception normal.        Mood and Affect: Mood and affect normal.        Speech: Speech normal.        Behavior: Behavior normal. Behavior is cooperative.        Bernita CANDIE Ned, PA-C

## 2024-05-10 ENCOUNTER — Emergency Department (HOSPITAL_COMMUNITY)
Admission: EM | Admit: 2024-05-10 | Discharge: 2024-05-10 | Disposition: A | Discharge status: Home / Self Care | Attending: Emergency Medicine | Admitting: Emergency Medicine

## 2024-05-10 ENCOUNTER — Emergency Department (HOSPITAL_COMMUNITY)

## 2024-05-10 ENCOUNTER — Other Ambulatory Visit: Payer: Self-pay

## 2024-05-10 DIAGNOSIS — N809 Endometriosis, unspecified: Secondary | ICD-10-CM | POA: Diagnosis not present

## 2024-05-10 DIAGNOSIS — N80C3 Endometriosis of the inguinal canal: Secondary | ICD-10-CM | POA: Insufficient documentation

## 2024-05-10 DIAGNOSIS — Z79899 Other long term (current) drug therapy: Secondary | ICD-10-CM | POA: Diagnosis not present

## 2024-05-10 DIAGNOSIS — R1031 Right lower quadrant pain: Secondary | ICD-10-CM

## 2024-05-10 DIAGNOSIS — I10 Essential (primary) hypertension: Secondary | ICD-10-CM | POA: Diagnosis not present

## 2024-05-10 LAB — CBC
HCT: 44.1 % (ref 36.0–46.0)
Hemoglobin: 14.8 g/dL (ref 12.0–15.0)
MCH: 28.1 pg (ref 26.0–34.0)
MCHC: 33.6 g/dL (ref 30.0–36.0)
MCV: 83.8 fL (ref 80.0–100.0)
Platelets: 210 K/uL (ref 150–400)
RBC: 5.26 MIL/uL — ABNORMAL HIGH (ref 3.87–5.11)
RDW: 13.2 % (ref 11.5–15.5)
WBC: 4.9 K/uL (ref 4.0–10.5)
nRBC: 0 % (ref 0.0–0.2)

## 2024-05-10 LAB — URINALYSIS, ROUTINE W REFLEX MICROSCOPIC
Bacteria, UA: NONE SEEN
Bilirubin Urine: NEGATIVE
Glucose, UA: NEGATIVE mg/dL
Ketones, ur: NEGATIVE mg/dL
Leukocytes,Ua: NEGATIVE
Nitrite: NEGATIVE
Protein, ur: 30 mg/dL — AB
RBC / HPF: >50 RBC/hpf (ref 0–5)
Specific Gravity, Urine: 1.019 (ref 1.005–1.030)
pH: 6 (ref 5.0–8.0)

## 2024-05-10 LAB — COMPREHENSIVE METABOLIC PANEL WITH GFR
ALT: 12 U/L (ref 0–44)
AST: 21 U/L (ref 15–41)
Albumin: 4.3 g/dL (ref 3.5–5.0)
Alkaline Phosphatase: 89 U/L (ref 38–126)
Anion gap: 10 (ref 5–15)
BUN: 10 mg/dL (ref 6–20)
CO2: 23 mmol/L (ref 22–32)
Calcium: 9.2 mg/dL (ref 8.9–10.3)
Chloride: 108 mmol/L (ref 98–111)
Creatinine, Ser: 1.02 mg/dL — ABNORMAL HIGH (ref 0.44–1.00)
GFR, Estimated: >60 mL/min (ref >60)
Glucose, Bld: 94 mg/dL (ref 70–99)
Potassium: 4 mmol/L (ref 3.5–5.1)
Sodium: 140 mmol/L (ref 135–145)
Total Bilirubin: 0.5 mg/dL (ref 0.0–1.2)
Total Protein: 7.5 g/dL (ref 6.5–8.1)

## 2024-05-10 LAB — HCG, SERUM, QUALITATIVE: Preg, Serum: NEGATIVE

## 2024-05-10 LAB — LIPASE, BLOOD: Lipase: 37 U/L (ref 11–51)

## 2024-05-10 MED ORDER — CYCLOBENZAPRINE HCL 10 MG PO TABS: 10.0000 mg | ORAL_TABLET | Freq: Two times a day (BID) | ORAL | 0 refills | Status: AC | PRN

## 2024-05-10 MED ORDER — KETOROLAC TROMETHAMINE 15 MG/ML IJ SOLN
15.0000 mg | Freq: Once | INTRAMUSCULAR | Status: AC
  Administered 2024-05-10: 15 mg via INTRAVENOUS
  Filled 2024-05-10: qty 1

## 2024-05-10 MED ORDER — CYCLOBENZAPRINE HCL 10 MG PO TABS
5.0000 mg | ORAL_TABLET | Freq: Once | ORAL | Status: AC
  Administered 2024-05-10: 5 mg via ORAL
  Filled 2024-05-10: qty 1

## 2024-05-10 MED ORDER — IOHEXOL 300 MG/ML  SOLN
100.0000 mL | Freq: Once | INTRAMUSCULAR | Status: AC | PRN
  Administered 2024-05-10: 100 mL via INTRAVENOUS

## 2024-05-10 MED ORDER — LACTATED RINGERS IV BOLUS
1000.0000 mL | Freq: Once | INTRAVENOUS | Status: AC
  Administered 2024-05-10: 1000 mL via INTRAVENOUS

## 2024-05-10 NOTE — Discharge Instructions (Addendum)
 Kiara Klein  Thank you for allowing us  to take care of you today.  You came to the Emergency Department today because you have had repeat pain in your right lower area of your belly similar to what you have been having over the past year.  Here in the emergency department you had labs which were reassuring.  We gave you some Toradol and a muscle relaxer called Flexeril with improvement of your pain.  The CT does show that you have some areas of inflammation and tissue along your inguinal canal into your from ligament of your uterus, which could potentially be consistent with endometriosis.  The good news is that we are not seeing any abnormalities on your labs or imaging that would require emergency surgery or admission to the hospital.  The only way to 100% diagnose endometriosis is to do a surgery where they look at your abdominal organs usually on a camera (diagnostic laparoscopy).  We recommend continuing to treat your symptoms with ibuprofen  every 4-6 hours as needed, and we will also send you in a prescription for a muscle relaxer.  You should follow-up closely with your OB/GYN and discuss these results with them, and see if and their expertise these findings on your CT or enough to diagnose you with endometriosis, and or if there should be next that is like further tests.  To-Do: 1. Please follow-up with your primary doctor within 1 - 2 weeks / as soon as possible.   Please return to the Emergency Department or call 911 if you experience have worsening of your symptoms, or do not get better, chest pain, shortness of breath, severe or significantly worsening pain, high fever, severe confusion, pass out or have any reason to think that you need emergency medical care.   We hope you feel better soon.   Mitzie Later, MD Department of Emergency Medicine [facility] [place]  Unusual enhancing and mildly inflamed 2.6 cm area of soft tissue within the  right inguinal canal.  And a smaller 1.6 cm nodule of similar enhancement just  inside the pelvis - seemingly associated with the ipsilateral round ligament.  Constellation of of clinical and imaging findings most suspicious for  symptomatic endometriosis in the right inguinal canal.

## 2024-05-10 NOTE — ED Notes (Signed)
 Pt transport ct

## 2024-05-10 NOTE — ED Provider Notes (Signed)
 [place] EMERGENCY DEPARTMENT AT [facility] Provider Note   CSN: [id number] Arrival date & time: 05/10/24  9357     History Chief Complaint  Patient presents with   Abdominal Pain   Back Pain    HPI: Kiara Klein is a 38 y.o. female with history pertinent for hypertension, elevated BMI, constipation who presents complaining of right lower quadrant pain. Patient arrived via POV.  History provided by patient.  No interpreter required during this encounter.  Patient reports that she has had worsening of her chronic right lower quadrant pain radiating to her back.  Reports that for approximately 1 year she has had intermittent right lower quadrant pain, particularly when associated with her menstrual period.  Reports that she has previously undergone a transvaginal ultrasound with her gynecologist who told her they were no acute pathologies, also has previously been presumptively diagnosed with constipation, however has not had relief with MiraLAX .  Reports that her menstrual cycle restarted approximately 2 days ago, and she began having right lower quadrant pain last night.  It was partially improved with ibuprofen , however became severe overnight, and states that it was the most severe that it ever had been, therefore she decided to come to the emergency department.  Reports that pain has partially improved spontaneously.  Denies any fever, chills, chest pain, shortness of breath, nausea, vomiting, diarrhea.  Patient's recorded medical, surgical, social, medication list and allergies were reviewed in the Snapshot window as part of the initial history.   Prior to Admission medications   Medication Sig Start Date End Date Taking? Authorizing Provider  cyclobenzaprine (FLEXERIL) 10 MG tablet Take 1 tablet (10 mg total) by mouth 2 (two) times daily as needed for muscle spasms. 05/10/24  Yes Rogelia Jerilynn RAMAN, MD  benzonatate  (TESSALON ) 100 MG capsule Take 1 capsule (100 mg  total) by mouth every 8 (eight) hours. 07/27/23   Dreama, [place]  N, FNP  busPIRone (BUSPAR) 15 MG tablet Take by mouth. 10/13/21   [provider]  Cholecalciferol 50 MCG (2000 UT) TABS Take 2,000 Units by mouth.    [provider]  escitalopram  (LEXAPRO ) 10 MG tablet Take 1 tablet (10 mg total) by mouth daily. appt required for future refills 07/21/20   Just, Kelsea J, FNP  fluticasone  (FLONASE ) 50 MCG/ACT nasal spray Place 1 spray into both nostrils daily. 11/08/21   Raspet, Erin K, PA-C  hydrochlorothiazide  (HYDRODIURIL ) 25 MG tablet Take 1 tablet (25 mg total) by mouth daily. 04/17/20   Just, Kelsea J, FNP  meclizine  (ANTIVERT ) 25 MG tablet Take 1 tablet (25 mg total) by mouth 3 (three) times daily as needed for dizziness. 04/17/20   Just, Kelsea J, FNP  nitrofurantoin , macrocrystal-monohydrate, (MACROBID ) 100 MG capsule Take 1 capsule (100 mg total) by mouth 2 (two) times daily. 01/06/24   Melonie Locus, PA-C  Vitamin D, Ergocalciferol, (DRISDOL) 1.25 MG (50000 UNIT) CAPS capsule Take 50,000 Units by mouth once a week.    [provider]     Allergies: Bactrim [sulfamethoxazole-trimethoprim] and Penicillins   Review of Systems   ROS as per HPI  Physical Exam Updated Vital Signs BP (!) 136/105 (BP Location: Right Arm)   Pulse 78   Temp 98.1 F (36.7 C) (Oral)   Resp 18   LMP 03/22/2024   SpO2 100%  Physical Exam Vitals and nursing note reviewed.  Constitutional:      General: She is not in acute distress.    Appearance: She is well-developed.  HENT:     Head: Normocephalic and atraumatic.  Eyes:     Conjunctiva/sclera: Conjunctivae normal.  Cardiovascular:     Rate and Rhythm: Normal rate and regular rhythm.     Heart sounds: No murmur heard. Pulmonary:     Effort: Pulmonary effort is normal. No respiratory distress.     Breath sounds: Normal breath sounds.  Abdominal:     Palpations: Abdomen is soft.     Tenderness: There is abdominal  tenderness in the right lower quadrant. There is no right CVA tenderness. Negative signs include Murphy's sign.  Musculoskeletal:        General: No swelling.     Cervical back: Neck supple.  Skin:    General: Skin is warm and dry.     Capillary Refill: Capillary refill takes less than 2 seconds.  Neurological:     Mental Status: She is alert.  Psychiatric:        Mood and Affect: Mood normal.     ED Course/ Medical Decision Making/ A&P    Procedures Procedures   Medications Ordered in ED Medications  cyclobenzaprine (FLEXERIL) tablet 5 mg (5 mg Oral Given 05/10/24 1033)  ketorolac (TORADOL) 15 MG/ML injection 15 mg (15 mg Intravenous Given 05/10/24 1021)  lactated ringers bolus 1,000 mL (1,000 mLs Intravenous New Bag/Given 05/10/24 1021)  iohexol (OMNIPAQUE) 300 MG/ML solution 100 mL (100 mLs Intravenous Contrast Given 05/10/24 1101)    Medical Decision Making:   Kiara Klein is a 38 y.o. female who presents for right lower quadrant pain radiating to her back as per above.  Physical exam is pertinent for mild right lower quadrant tenderness.   The differential includes but is not limited to menstrual associated pain, endometriosis, ovarian pathology, pregnancy, pyelonephritis, nephrolithiasis, muscular strain, appendicitis, pregnancy, ectopic pregnancy.  Independent historian: None  External data reviewed: Notes: Reviewed patient's prior outpatient notes including her recent PCP note on 11/14 that notes that patient has had insufficient relief with MiraLAX , and outpatient CT was ordered.  Also reviewed patient's prior OB/GYN notes noting that they feel that patient's symptoms are unlikely to be gynecologic in origin  Initial Plan:  Screening labs including CBC and Metabolic panel to evaluate for infectious or metabolic etiology of disease.  Screening lipase to evaluate for pancreatitis Screening hCG to evaluate for pregnancy Urinalysis with reflex culture ordered  to evaluate for UTI or relevant urologic/nephrologic pathology.  CT abdomen pelvis to evaluate for structural/infectious intra-abdominal pathology.  Objective evaluation as below reviewed   Labs: Ordered, Independent interpretation, and Details: Lipase WNL.  CMP without AKI, read do not try judgment, emergent LFT abnormality.  CBC without leukocytosis, anemia, thrombocytopenia.  hCG negative.  UA with hematuria, potentially contamination from menstrual blood, negative LE, nitrites, doubt UTI.  Radiology: Ordered, Independent interpretation, Details: Personally reviewed CT of abdomen and pelvis, do not appreciate free air, significant free fluid, obstructive bowel gas pattern, focal hyperenhancement, and All images reviewed independently.  Agree with radiology report at this time.   CT ABDOMEN PELVIS W CONTRAST Result Date: 05/10/2024 EXAM: CT ABDOMEN AND PELVIS WITH CONTRAST 05/10/2024 11:08:39 AM TECHNIQUE: CT of the abdomen and pelvis was performed with the administration of 100 mL of iohexol (OMNIPAQUE) 300 MG/ML solution. Multiplanar reformatted images are provided for review. Automated exposure control, iterative reconstruction, and/or weight-based adjustment of the mA/kV was utilized to reduce the radiation dose to as low as reasonably achievable. COMPARISON: CT abdomen and pelvis 07/24/2010. CLINICAL HISTORY: 38 year old female with  right lower quad abdominal pain, lower back pain since the beginning of the year and getting worse. Had a trans vag US  last month and was told her scan was negative. Patient states she has this pain every time she has her menstrual period, and Patient is currently on her menstrual. FINDINGS: LOWER CHEST: No acute abnormality. LIVER: The liver is unremarkable. GALLBLADDER AND BILE DUCTS: Gallbladder is unremarkable. No biliary ductal dilatation. SPLEEN: No abnormality. PANCREAS: No abnormality. ADRENAL GLANDS: No abnormality. KIDNEYS, URETERS AND BLADDER: No stones in the  kidneys or ureters. No hydronephrosis. No perinephric or periureteral stranding. Urinary bladder is unremarkable. GI AND BOWEL: Stomach demonstrates no acute abnormality. There is no bowel obstruction. Appendix is not identified, but there is no inflammation around the cecum or distal small bowel in the right lower quadrant. PERITONEUM AND RETROPERITONEUM: No ascites. No free air. No pneumoperitoneum. No free fluid. VASCULATURE: Aorta is normal in caliber. LYMPH NODES: No lymphadenopathy. REPRODUCTIVE ORGANS: Right suprapubic, right inguinal canal asymmetric soft tissue with hyperdensity and/or enhancement (series 2 image 78) and mild regional inflammatory stranding. The lesion is mildly elongated, teardrop shaped and extends a length of 2.6 cm as seen on coronal image 52. No herniated bowel. Within the abdominal cavity deep to this lesion there is also a small oval hyperenhancing soft tissue focus measuring 1.6 cm. These appear to be somewhat associated with the round ligament on that side. Uterus and ovaries appear negative. BONES AND SOFT TISSUES: Incidental chronic pelvic phleboliths. No acute osseous abnormality. No focal soft tissue abnormality. IMPRESSION: 1. Unusual enhancing and mildly inflamed 2.6 cm area of soft tissue within the right inguinal canal. And a smaller 1.6 cm nodule of similar enhancement just inside the pelvis - seemingly associated with the ipsilateral round ligament. Constellation of of clinical and imaging findings most suspicious for symptomatic endometriosis in the right inguinal canal. 2. Appendix not identified, but otherwise negative CT appearance of the abdomen and pelvis. Electronically signed by: Helayne Hurst MD 05/10/2024 12:13 PM EST RP Workstation: [id number]    EKG/Medicine tests: Not indicated EKG Interpretation:    Interventions: LR, Toradol , Flexeril    See the EMR for full details regarding lab and imaging results.  Patient presents for worsening of chronic  intermittent abdominal pain.  Patient had screening labs obtained in triage.  Lipase WNL, doubt pancreatitis.  UA without evidence of UTI, no CVA tenderness to palpation, which makes pyelonephritis and nephrolithiasis unlikely.  CBC without leukocytosis, therefore doubt severe inflammatory or infectious intra-abdominal process.  Urine pregnancy negative, doubt ectopic, IUP.  CMP without metabolic derangement.  Given patient has had pain in the similar region correlating specifically with her menstrual cycle, pain may be ventral associated pain, therefore will treat with Flexeril  and Toradol , however given patient reports that pain is worse than baseline, feel that it is reasonable to obtain CT of the abdomen and pelvis to evaluate for emergent intra-abdominal pathology.  Patient at bedside was amenable to this plan.  Medications administered and CT obtained, and patient reports improvement in symptoms on reevaluation.  Tachycardia that patient had on arrival resolved, likely pain mediated.  CT does demonstrate soft tissue inflammation within the inguinal canal which is where patient localizes her pain, as well as potentially in the round ligament, possibly consistent with endometriosis.  Did discuss these findings with the patient at bedside, did discuss that the gold standard test for endometriosis diagnosis is diagnostic laparoscopy.  Discussed continued management with NSAIDs, also will prescribe course of Flexeril .  Encourage close follow-up with her gynecologist to guide next steps in diagnosis/management, patient comfortable with this plan.  Flexeril sent to preferred pharmacy.  Presentation is most consistent with acute complicated illness and I did consider and rule out acute life/limb-threatening illness  Discussion of management or test interpretations with external provider(s): Not indicated  Risk Drugs:Prescription drug management  Disposition: DISCHARGE: I believe that the patient is safe  for discharge home with outpatient follow-up. Patient was informed of all pertinent physical exam, laboratory, and imaging findings. Patient's suspected etiology of their symptom presentation was discussed with the patient and all questions were answered. We discussed following up with PCP and gynecology. I provided thorough ED return precautions. The patient feels safe and comfortable with this plan.  MDM generated using voice dictation software and may contain dictation errors.  Please contact me for any clarification or with any questions.  Clinical Impression:  1. Right lower quadrant pain   2. Endometriosis      Discharge   Final Clinical Impression(s) / ED Diagnoses Final diagnoses:  Right lower quadrant pain  Endometriosis    Rx / DC Orders ED Discharge Orders          Ordered    cyclobenzaprine (FLEXERIL) 10 MG tablet  2 times daily PRN        05/10/24 1249             Rogelia Jerilynn RAMAN, MD 05/10/24 1253

## 2024-05-10 NOTE — ED Triage Notes (Signed)
 Patient c/o right lower quad abdominal pain, lower back pain since the beginning of the year and it got worse. Had a trans vag US  last month and was told her scan was negative. Patient stated she has this pain every time she has her menstrual. Patient is currently on her menstrual. Pain started at 2am and had not gotten better. Patient emotional crying in triage.
